# Patient Record
Sex: Female | Born: 1940 | Race: White | Hispanic: No | Marital: Married | Smoking: Former smoker
Health system: Southern US, Community
[De-identification: ages and names within clinical notes are randomized; demographics above are authoritative.]

## PROBLEM LIST (undated history)

## (undated) DIAGNOSIS — Z923 Personal history of irradiation: Secondary | ICD-10-CM

## (undated) DIAGNOSIS — E785 Hyperlipidemia, unspecified: Secondary | ICD-10-CM

## (undated) DIAGNOSIS — K219 Gastro-esophageal reflux disease without esophagitis: Secondary | ICD-10-CM

## (undated) DIAGNOSIS — E039 Hypothyroidism, unspecified: Secondary | ICD-10-CM

## (undated) HISTORY — PX: OTHER SURGICAL HISTORY: SHX169

## (undated) HISTORY — DX: Hypothyroidism, unspecified: E03.9

## (undated) HISTORY — DX: Hyperlipidemia, unspecified: E78.5

## (undated) HISTORY — DX: Personal history of irradiation: Z92.3

## (undated) HISTORY — DX: Gastro-esophageal reflux disease without esophagitis: K21.9

---

## 1997-12-01 ENCOUNTER — Emergency Department (HOSPITAL_COMMUNITY): Admission: EM | Admit: 1997-12-01 | Discharge: 1997-12-01 | Payer: Self-pay | Admitting: Emergency Medicine

## 1998-01-28 ENCOUNTER — Other Ambulatory Visit: Admission: RE | Admit: 1998-01-28 | Discharge: 1998-01-28 | Payer: Self-pay | Admitting: Obstetrics and Gynecology

## 1998-02-03 ENCOUNTER — Ambulatory Visit (HOSPITAL_COMMUNITY): Admission: RE | Admit: 1998-02-03 | Discharge: 1998-02-03 | Payer: Self-pay | Admitting: Obstetrics and Gynecology

## 1998-03-04 ENCOUNTER — Ambulatory Visit (HOSPITAL_COMMUNITY): Admission: RE | Admit: 1998-03-04 | Discharge: 1998-03-04 | Payer: Self-pay | Admitting: Obstetrics and Gynecology

## 1998-06-27 ENCOUNTER — Other Ambulatory Visit: Admission: RE | Admit: 1998-06-27 | Discharge: 1998-06-27 | Payer: Self-pay | Admitting: Obstetrics and Gynecology

## 1998-06-29 ENCOUNTER — Encounter: Payer: Self-pay | Admitting: Emergency Medicine

## 1998-06-29 ENCOUNTER — Inpatient Hospital Stay (HOSPITAL_COMMUNITY): Admission: EM | Admit: 1998-06-29 | Discharge: 1998-07-01 | Payer: Self-pay | Admitting: Emergency Medicine

## 1998-06-29 ENCOUNTER — Encounter: Payer: Self-pay | Admitting: Orthopedic Surgery

## 1999-03-30 ENCOUNTER — Other Ambulatory Visit: Admission: RE | Admit: 1999-03-30 | Discharge: 1999-03-30 | Payer: Self-pay | Admitting: *Deleted

## 1999-10-04 ENCOUNTER — Other Ambulatory Visit: Admission: RE | Admit: 1999-10-04 | Discharge: 1999-10-04 | Payer: Self-pay | Admitting: Obstetrics and Gynecology

## 1999-10-05 ENCOUNTER — Other Ambulatory Visit: Admission: RE | Admit: 1999-10-05 | Discharge: 1999-10-05 | Payer: Self-pay | Admitting: Obstetrics and Gynecology

## 1999-10-05 ENCOUNTER — Encounter (INDEPENDENT_AMBULATORY_CARE_PROVIDER_SITE_OTHER): Payer: Self-pay

## 1999-10-10 ENCOUNTER — Ambulatory Visit (HOSPITAL_COMMUNITY): Admission: RE | Admit: 1999-10-10 | Discharge: 1999-10-10 | Payer: Self-pay | Admitting: Obstetrics and Gynecology

## 1999-10-10 ENCOUNTER — Encounter: Payer: Self-pay | Admitting: Obstetrics and Gynecology

## 1999-12-11 ENCOUNTER — Emergency Department (HOSPITAL_COMMUNITY): Admission: EM | Admit: 1999-12-11 | Discharge: 1999-12-11 | Payer: Self-pay

## 2000-01-25 ENCOUNTER — Encounter: Admission: RE | Admit: 2000-01-25 | Discharge: 2000-01-25 | Payer: Self-pay | Admitting: Obstetrics and Gynecology

## 2000-01-25 ENCOUNTER — Encounter: Payer: Self-pay | Admitting: Obstetrics and Gynecology

## 2000-07-03 ENCOUNTER — Other Ambulatory Visit: Admission: RE | Admit: 2000-07-03 | Discharge: 2000-07-03 | Payer: Self-pay | Admitting: Obstetrics and Gynecology

## 2000-10-22 ENCOUNTER — Other Ambulatory Visit: Admission: RE | Admit: 2000-10-22 | Discharge: 2000-10-22 | Payer: Self-pay | Admitting: Obstetrics and Gynecology

## 2000-10-23 ENCOUNTER — Encounter (INDEPENDENT_AMBULATORY_CARE_PROVIDER_SITE_OTHER): Payer: Self-pay | Admitting: Specialist

## 2000-10-23 ENCOUNTER — Ambulatory Visit (HOSPITAL_COMMUNITY): Admission: RE | Admit: 2000-10-23 | Discharge: 2000-10-23 | Payer: Self-pay | Admitting: Obstetrics and Gynecology

## 2000-11-28 ENCOUNTER — Ambulatory Visit (HOSPITAL_COMMUNITY): Admission: RE | Admit: 2000-11-28 | Discharge: 2000-11-28 | Payer: Self-pay | Admitting: Obstetrics and Gynecology

## 2000-11-28 ENCOUNTER — Encounter: Payer: Self-pay | Admitting: Obstetrics and Gynecology

## 2001-02-03 ENCOUNTER — Encounter: Admission: RE | Admit: 2001-02-03 | Discharge: 2001-02-03 | Payer: Self-pay | Admitting: Obstetrics and Gynecology

## 2001-02-03 ENCOUNTER — Encounter: Payer: Self-pay | Admitting: Obstetrics and Gynecology

## 2001-04-21 ENCOUNTER — Encounter: Admission: RE | Admit: 2001-04-21 | Discharge: 2001-04-21 | Payer: Self-pay | Admitting: Internal Medicine

## 2001-04-21 ENCOUNTER — Encounter: Payer: Self-pay | Admitting: Internal Medicine

## 2001-05-29 ENCOUNTER — Encounter (INDEPENDENT_AMBULATORY_CARE_PROVIDER_SITE_OTHER): Payer: Self-pay

## 2001-05-29 ENCOUNTER — Ambulatory Visit (HOSPITAL_COMMUNITY): Admission: RE | Admit: 2001-05-29 | Discharge: 2001-05-29 | Payer: Self-pay | Admitting: *Deleted

## 2002-01-15 ENCOUNTER — Encounter: Payer: Self-pay | Admitting: Obstetrics and Gynecology

## 2002-01-15 ENCOUNTER — Ambulatory Visit (HOSPITAL_COMMUNITY): Admission: RE | Admit: 2002-01-15 | Discharge: 2002-01-15 | Payer: Self-pay | Admitting: Obstetrics and Gynecology

## 2002-02-12 ENCOUNTER — Encounter: Admission: RE | Admit: 2002-02-12 | Discharge: 2002-02-12 | Payer: Self-pay | Admitting: Obstetrics and Gynecology

## 2002-02-12 ENCOUNTER — Encounter: Payer: Self-pay | Admitting: Obstetrics and Gynecology

## 2002-06-02 ENCOUNTER — Encounter (INDEPENDENT_AMBULATORY_CARE_PROVIDER_SITE_OTHER): Payer: Self-pay | Admitting: Specialist

## 2002-06-02 ENCOUNTER — Ambulatory Visit (HOSPITAL_COMMUNITY): Admission: RE | Admit: 2002-06-02 | Discharge: 2002-06-02 | Payer: Self-pay | Admitting: *Deleted

## 2003-03-11 ENCOUNTER — Other Ambulatory Visit: Admission: RE | Admit: 2003-03-11 | Discharge: 2003-03-11 | Payer: Self-pay | Admitting: Obstetrics and Gynecology

## 2003-03-24 ENCOUNTER — Encounter: Payer: Self-pay | Admitting: Obstetrics and Gynecology

## 2003-03-24 ENCOUNTER — Encounter: Admission: RE | Admit: 2003-03-24 | Discharge: 2003-03-24 | Payer: Self-pay | Admitting: Obstetrics and Gynecology

## 2003-10-15 ENCOUNTER — Ambulatory Visit (HOSPITAL_COMMUNITY): Admission: RE | Admit: 2003-10-15 | Discharge: 2003-10-15 | Payer: Self-pay | Admitting: *Deleted

## 2003-10-15 ENCOUNTER — Encounter (INDEPENDENT_AMBULATORY_CARE_PROVIDER_SITE_OTHER): Payer: Self-pay | Admitting: Specialist

## 2004-04-06 ENCOUNTER — Encounter (HOSPITAL_COMMUNITY): Admission: RE | Admit: 2004-04-06 | Discharge: 2004-06-08 | Payer: Self-pay | Admitting: Endocrinology

## 2004-05-17 ENCOUNTER — Other Ambulatory Visit: Admission: RE | Admit: 2004-05-17 | Discharge: 2004-05-17 | Payer: Self-pay | Admitting: Obstetrics and Gynecology

## 2004-06-27 ENCOUNTER — Encounter (HOSPITAL_COMMUNITY): Admission: RE | Admit: 2004-06-27 | Discharge: 2004-09-25 | Payer: Self-pay | Admitting: Endocrinology

## 2004-08-14 ENCOUNTER — Ambulatory Visit (HOSPITAL_COMMUNITY): Admission: RE | Admit: 2004-08-14 | Discharge: 2004-08-14 | Payer: Self-pay | Admitting: Endocrinology

## 2005-05-07 ENCOUNTER — Ambulatory Visit (HOSPITAL_COMMUNITY): Admission: RE | Admit: 2005-05-07 | Discharge: 2005-05-07 | Payer: Self-pay | Admitting: Obstetrics and Gynecology

## 2005-05-21 ENCOUNTER — Other Ambulatory Visit: Admission: RE | Admit: 2005-05-21 | Discharge: 2005-05-21 | Payer: Self-pay | Admitting: Obstetrics and Gynecology

## 2005-11-12 ENCOUNTER — Encounter (INDEPENDENT_AMBULATORY_CARE_PROVIDER_SITE_OTHER): Payer: Self-pay | Admitting: Specialist

## 2005-11-12 ENCOUNTER — Ambulatory Visit (HOSPITAL_COMMUNITY): Admission: RE | Admit: 2005-11-12 | Discharge: 2005-11-12 | Payer: Self-pay | Admitting: *Deleted

## 2006-01-01 ENCOUNTER — Ambulatory Visit (HOSPITAL_COMMUNITY): Admission: RE | Admit: 2006-01-01 | Discharge: 2006-01-01 | Payer: Self-pay | Admitting: *Deleted

## 2006-04-03 ENCOUNTER — Encounter: Admission: RE | Admit: 2006-04-03 | Discharge: 2006-04-03 | Payer: Self-pay | Admitting: Endocrinology

## 2006-06-07 ENCOUNTER — Ambulatory Visit (HOSPITAL_COMMUNITY): Admission: RE | Admit: 2006-06-07 | Discharge: 2006-06-07 | Payer: Self-pay | Admitting: Obstetrics and Gynecology

## 2007-01-01 ENCOUNTER — Encounter: Admission: RE | Admit: 2007-01-01 | Discharge: 2007-01-01 | Payer: Self-pay | Admitting: Endocrinology

## 2007-09-01 ENCOUNTER — Ambulatory Visit (HOSPITAL_COMMUNITY): Admission: RE | Admit: 2007-09-01 | Discharge: 2007-09-01 | Payer: Self-pay | Admitting: *Deleted

## 2007-09-01 ENCOUNTER — Encounter (INDEPENDENT_AMBULATORY_CARE_PROVIDER_SITE_OTHER): Payer: Self-pay | Admitting: *Deleted

## 2008-04-07 ENCOUNTER — Ambulatory Visit (HOSPITAL_COMMUNITY): Admission: RE | Admit: 2008-04-07 | Discharge: 2008-04-07 | Payer: Self-pay | Admitting: Obstetrics and Gynecology

## 2009-10-19 ENCOUNTER — Encounter
Admission: RE | Admit: 2009-10-19 | Discharge: 2009-10-19 | Payer: Self-pay | Source: Home / Self Care | Admitting: Neurosurgery

## 2010-03-02 ENCOUNTER — Ambulatory Visit (HOSPITAL_COMMUNITY): Admission: RE | Admit: 2010-03-02 | Discharge: 2010-03-02 | Payer: Self-pay | Admitting: Obstetrics and Gynecology

## 2010-06-14 ENCOUNTER — Emergency Department (HOSPITAL_COMMUNITY)
Admission: EM | Admit: 2010-06-14 | Discharge: 2010-06-14 | Payer: Self-pay | Source: Home / Self Care | Admitting: Emergency Medicine

## 2010-06-14 LAB — URINALYSIS, ROUTINE W REFLEX MICROSCOPIC
Bilirubin Urine: NEGATIVE
Hemoglobin, Urine: NEGATIVE
Ketones, ur: NEGATIVE mg/dL
Nitrite: NEGATIVE
Protein, ur: NEGATIVE mg/dL
Specific Gravity, Urine: 1.015 (ref 1.005–1.030)
Urine Glucose, Fasting: NEGATIVE mg/dL
Urobilinogen, UA: 0.2 mg/dL (ref 0.0–1.0)
pH: 6.5 (ref 5.0–8.0)

## 2010-06-14 LAB — CBC
HCT: 41 % (ref 36.0–46.0)
Hemoglobin: 13.4 g/dL (ref 12.0–15.0)
MCH: 29.3 pg (ref 26.0–34.0)
MCHC: 32.7 g/dL (ref 30.0–36.0)
MCV: 89.7 fL (ref 78.0–100.0)
Platelets: 155 10*3/uL (ref 150–400)
RBC: 4.57 MIL/uL (ref 3.87–5.11)
RDW: 12.9 % (ref 11.5–15.5)
WBC: 12.3 10*3/uL — ABNORMAL HIGH (ref 4.0–10.5)

## 2010-06-14 LAB — HEMOCCULT GUIAC POC 1CARD (OFFICE): Fecal Occult Bld: NEGATIVE

## 2010-06-14 LAB — POCT I-STAT, CHEM 8
BUN: 13 mg/dL (ref 6–23)
Calcium, Ion: 0.94 mmol/L — ABNORMAL LOW (ref 1.12–1.32)
Chloride: 105 mEq/L (ref 96–112)
Creatinine, Ser: 0.8 mg/dL (ref 0.4–1.2)
Glucose, Bld: 158 mg/dL — ABNORMAL HIGH (ref 70–99)
HCT: 44 % (ref 36.0–46.0)
Hemoglobin: 15 g/dL (ref 12.0–15.0)
Potassium: 4.6 mEq/L (ref 3.5–5.1)
Sodium: 135 mEq/L (ref 135–145)
TCO2: 23 mmol/L (ref 0–100)

## 2010-06-14 LAB — DIFFERENTIAL
Basophils Absolute: 0 10*3/uL (ref 0.0–0.1)
Basophils Relative: 0 % (ref 0–1)
Eosinophils Absolute: 0 10*3/uL (ref 0.0–0.7)
Eosinophils Relative: 0 % (ref 0–5)
Lymphocytes Relative: 6 % — ABNORMAL LOW (ref 12–46)
Lymphs Abs: 0.7 10*3/uL (ref 0.7–4.0)
Monocytes Absolute: 1 10*3/uL (ref 0.1–1.0)
Monocytes Relative: 9 % (ref 3–12)
Neutro Abs: 10.5 10*3/uL — ABNORMAL HIGH (ref 1.7–7.7)
Neutrophils Relative %: 85 % — ABNORMAL HIGH (ref 43–77)

## 2010-07-01 ENCOUNTER — Encounter: Payer: Self-pay | Admitting: Obstetrics and Gynecology

## 2010-07-01 ENCOUNTER — Encounter: Payer: Self-pay | Admitting: Cardiology

## 2010-07-02 ENCOUNTER — Encounter: Payer: Self-pay | Admitting: Endocrinology

## 2010-07-06 LAB — OCCULT BLOOD, POC DEVICE: Fecal Occult Bld: NEGATIVE

## 2010-10-24 NOTE — Op Note (Signed)
NAMEMALU, PELLEGRINI                ACCOUNT NO.:  [id number]   MEDICAL RECORD NO.:  [id number]          PATIENT TYPE:  AMB   LOCATION:  ENDO                         FACILITY:  [facility]   PHYSICIAN:  Georgiana Spinner, M.D.    DATE OF BIRTH:  August 17, 1940   DATE OF PROCEDURE:  09/01/2007  DATE OF DISCHARGE:                               OPERATIVE REPORT   PROCEDURE:  Upper endoscopy.   INDICATIONS:  GERD.   ANESTHESIA:  Fentanyl 75 mcg, Versed 7.5 mg.   DESCRIPTION OF PROCEDURE:  With the patient mildly sedated in the left  lateral decubitus position, the Pentax videoscopic endoscope was  inserted in the mouth, passed under direct vision through the esophagus  which appeared normal except for one small area of Barrett's esophagus  which we biopsied and photographed.  We entered into the stomach,  fundus, body, antrum, duodenal bulb, and second portion duodenum and all  appeared normal.   From this point the endoscope was slowly withdrawn taking  circumferential views of the duodenal mucosa until the endoscope had  been pulled back, and the stomach placed in retroflexion to view the  stomach from below.  The endoscope was straightened and withdrawn taking  circumferential views of the remaining gastric and esophageal mucosa.  The patient's vital signs and pulse oximeter remained stable.  The  patient tolerated the procedure well without apparent complications.   FINDINGS:  Barrett's esophagus biopsied and photographed.  Await biopsy  report.  The patient will call me for results, and follow up with me as  an outpatient.  Proceed to colonoscopy           ______________________________  Georgiana Spinner, M.D.     GMO/MEDQ  D:  09/01/2007  T:  09/01/2007  Job:  [id number]

## 2010-10-24 NOTE — Op Note (Signed)
[name], [name]                ACCOUNT NO.:  [id number]   MEDICAL RECORD NO.:  [id number]          PATIENT TYPE:  AMB   LOCATION:  ENDO                         FACILITY:  [facility]   PHYSICIAN:  Georgiana Spinner, M.D.    DATE OF BIRTH:  [birth year]   DATE OF PROCEDURE:  09/01/2007  DATE OF DISCHARGE:                               OPERATIVE REPORT   PROCEDURE:  Colonoscopy with polypectomy.   INDICATIONS:  Colon polyps.   ANESTHESIA:  Fentanyl 50 mcg, Versed 5 mg.   PROCEDURE:  With the patient mildly sedated in the left lateral  decubitus position, the Pentax videoscopic colonoscope was inserted in  the rectum, passed through a diverticula filled sigmoid colon to reach  the cecum identified by ileocecal valve and appendiceal orifice, both of  which were photographed.  From this point the colonoscope was slowly  withdrawn taking circumferential views of colonic mucosa stopping at 40  centimeters from anal verge at which point a polyp was seen and  photographed and removed using snare cautery technique setting of 20/150  blended current polyp was retrieved by suctioning it through the  endoscope into a tissue trap.  From this point colonoscope was slowly  withdrawn taking circumferential views remaining colonic mucosa as we  withdrew all the way to the rectum which appeared normal on direct  showed hemorrhoids on retroflexed view.  The endoscope was straightened  and withdrawn.  The patient's vital signs, pulse oximeter remained  stable.  The patient tolerated procedure well without apparent  complications.   FINDINGS:  Internal hemorrhoids moderately severe, sigmoid  diverticulosis and polyp at 40 cm.  Await biopsy report.  The patient  will call me for results and follow-up with me as an outpatient.           ______________________________  Georgiana Spinner, M.D.     GMO/MEDQ  D:  09/01/2007  T:  09/01/2007  Job:  [id number]

## 2010-10-27 NOTE — Procedures (Signed)
[facility]  Patient:    [name], [name] Visit Number: [id number] MRN: [id number]          Service Type: END Location: ENDO Attending Physician:  Sabino Gasser Dictated by:   Sabino Gasser, M.D. Admit Date:  05/29/2001                             Procedure Report  PROCEDURE:  Endoscopy.  SURGEON:  Sabino Gasser, M.D.  INDICATIONS:  Abdominal pain.  ANESTHESIA:  Demerol 60 and Versed 6 mg.  DESCRIPTION OF PROCEDURE:  With the patient mildly sedated in the left lateral decubitus position, the Olympus videoscopic endoscope was inserted in the mouth, and passed under direct vision to the esophagus.  The distal esophagus was approached and showed areas of Barretts esophagus, photographed, and biopsied.  We entered into the stomach.  The fundus, body, antrum, duodenal bulb, and second portion of the duodenum all appeared normal.  From this point, the endoscope was slowly withdrawn taking circumferential views of the entire duodenal mucosa until the endoscope had been pulled back into the stomach and placed on retroflexion to view the stomach from below and this showed a hiatal hernia.  The endoscope was then straightened and withdrawn, taking circumferential views of the entire gastric and esophageal mucosa.  The patients vital signs and pulse oximeter remained stable.  The patient tolerated the procedure well and without apparent complications.  FINDINGS:  There appears to be Barretts esophagus above a hiatal hernia, biopsied.  Await biopsy report.  The patient will call me for the results and follow up with me as an outpatient. Dictated by:   Sabino Gasser, M.D. Attending Physician:  Sabino Gasser DD:  05/29/01 TD:  05/30/01 Job: [id number]

## 2010-10-27 NOTE — Op Note (Signed)
NAMELASHENA, SIGNER NO.:  [id number]   MEDICAL RECORD NO.:  [id number]          PATIENT TYPE:  AMB   LOCATION:  ENDO                         FACILITY:  [facility]   PHYSICIAN:  Georgiana Spinner, M.D.    DATE OF BIRTH:  1941/03/07   DATE OF PROCEDURE:  11/12/2005  DATE OF DISCHARGE:                                 OPERATIVE REPORT   PROCEDURE:  Upper endoscopy with biopsy.   INDICATIONS:  GERD.   ANESTHESIA:  Demerol 60, Versed 5 mg.   PROCEDURE:  With the patient mildly sedated in the left lateral decubitus  position the Olympus videoscopic endoscope was inserted in the mouth and  passed under direct vision through the esophagus which appeared normal until  we reached distal esophagus and there were changes of Barrett's photographed  and biopsied.  We entered into the stomach. Fundus, body, antrum, duodenal  bulb, second portion duodenum appeared normal. From this point the endoscope  was slowly withdrawn taking circumferential views of duodenal mucosa until  the endoscope then pulled back into the stomach and placed in retroflexion  to view the stomach from below. A loose wrap of the GE junction around the  endoscope was noted. The endoscope was straightened and withdrawn taking  circumferential views remaining gastric and esophageal mucosa.  The  patient's vital signs, pulse oximeter remained stable.  The patient  tolerated procedure well without apparent complications.   FINDINGS:  Barrett's esophagus above a loose wrap of the GE junction. Await  biopsy report.  The patient will call me for results and follow-up with me  as an outpatient.           ______________________________  Georgiana Spinner, M.D.     GMO/MEDQ  D:  11/12/2005  T:  11/12/2005  Job:  [id number]

## 2010-10-27 NOTE — Op Note (Signed)
[facility]  Patient:    Brittany Sawyer, Brittany Sawyer Visit Number: [id number] MRN: [id number]          Service Type: END Location: ENDO Attending Physician:  Sabino Gasser Dictated by:   Sabino Gasser, M.D. Proc. Date: 05/29/01 Admit Date:  05/29/2001                             Operative Report  PROCEDURE:  Colonoscopy.  INDICATIONS:  Colon polyp.  ANESTHESIA:  Demerol 60 mg, Versed 6 mg.  DESCRIPTION OF PROCEDURE:   With the patient mildly sedated in the left lateral decubitus position, the Olympus Videoscopic Colonoscope was inserted in the rectum and passed under direct vision into the cecum identified by the ileocecal valve and appendiceal orifice, both of which were photographed. From this point, the colonoscope was fully withdrawn taking circumferential views of the entire colonic mucosa stopping at approximately 20 cm from the anal verge at which point some diverticula were seen and fairly large polyp probably in the order of about 1 cm, it was fairly sessile and soft. It was photographed and subsequently using snare cautery technique setting of 20.20 blended current, the polyp was ensnared and removed and tissue was retrieved by suctioning. There was a small residual amount of polypoid tissue and we ensnared this but the tissue evulsed without cautery. There was some bleeding at this site and it was injected with epinephrine 1.5 cc in divided doses. There appeared to be good hemostasis at this point. The endoscope was then withdrawn taking circumferential views of the remaining colonic mucosa stopping in the rectum, which appeared normal in direct and showed hemorrhoids in retroflex view. The endoscope was straightened and withdrawn. The patients vital signs and pulse oximeter remained stable. The patient tolerated the procedure well without apparent complications.  FINDINGS: 1. Diverticulosis throughout the colon but much more so in the sigmoid  area. 2/ Polyp, fairly large at 20 cm.  PLAN:  Will await tissue biopsy report. Will avoid NSAIDs, anticoagulants, and place the patient on a low residue diet for the next two weeks. Dictated by:   Sabino Gasser, M.D. Attending Physician:  Sabino Gasser DD:  05/29/01 TD:  05/30/01 Job: [id number]

## 2010-10-27 NOTE — Op Note (Signed)
[facility]  Patient:    Brittany Sawyer, Brittany Sawyer                       MRN: [id number] Proc. Date: 10/23/00 Adm. Date:  [id number] Attending:  Malon Kindle                           Operative Report  PREOPERATIVE DIAGNOSIS:  Abnormal uterine bleeding, persistent, on hormone replacement therapy.  POSTOPERATIVE DIAGNOSIS:  Abnormal uterine bleeding, persistent, on hormone replacement therapy with large endometrial polyp.  OPERATION:  Dilation and curettage, hysteroscopic resection of an endometrial polyp.  OPERATOR:  Malachi Pro. Ambrose Mantle, M.D.  ANESTHESIA:  General.  DESCRIPTION OF PROCEDURE:  The patient was brought to the operating room and placed under satisfactory general anesthesia and placed in lithotomy position. The vulva and vagina were prepped with Betadine solution.  The exam revealed the uterus to be posterior, normal size.  The adnexa were free of masses.  The area was draped as a sterile field.  The cervix was drawn into the operative field.  I could not sound the uterus because of the tight cervix.  I therefore tried the small dilators, could not sound the cervix.  Then using the 7-8 dilator on the Hank dilators, I was able to get into the endometrial cavity. I dilated her up to about a 26 dilator when it became quite difficult, so then I used a small hysteroscope and was able to visualize the endometrial polyp. So to be able to use the operative hysteroscope, I dilated her up to a 30 Hanks dilator and then was able to introduce the large hysteroscope equipped with the biopsy instrument.  I took out part of the polyp and visualized it on the posterior surface of the endometrial cavity close to the fundus and then used the polyp forceps without the scope to remove large chunks of the polyp. I then reintroduced the hysteroscope, further reduced the size of the polyp down so that it was flush with the endometrial cavity.  I could see both  tubal ostia extremely well.  There were no other abnormalities in the endometrial cavity.  I did an endocervical and then an endometrial curettage and completed the procedure.  Blood loss was probably less than 5 cc.  The patient was returned to recovery in satisfactory condition. DD:  10/23/00 TD:  10/23/00 Job: [id number]

## 2010-10-27 NOTE — Op Note (Signed)
   NAME:  Brittany Sawyer, Brittany Sawyer                          ACCOUNT NO.:  [id number]   MEDICAL RECORD NO.:  [id number]                   PATIENT TYPE:  AMB   LOCATION:  ENDO                                 FACILITY:  [facility]   PHYSICIAN:  Georgiana Spinner, M.D.                 DATE OF BIRTH:  February 22, 1941   DATE OF PROCEDURE:  06/02/2002  DATE OF DISCHARGE:                                 OPERATIVE REPORT   PROCEDURE:  Endoscopy with biopsy.   INDICATIONS FOR PROCEDURE:  Barrett's esophagus.   ANESTHESIA:  Demerol 50, Versed 5 mg.   PROCEDURE:  With the patient mildly sedated in the left lateral decubitus  position, the Olympus videoscopic endoscope was inserted in the mouth and  passed under direct vision through the esophagus and distal esophagus was  visualized and definite Barrett's was seen, photographed, and biopsied.  We  entered into the stomach.  The fundus, body, antrum, duodenal bulb, and  second portion of the duodenum appeared normal.  From this point, the  endoscope was slowly withdrawn taking circumferential views of the duodenal  mucosa until the endoscope was pulled back into the stomach and placed in  retroflexion to view the stomach from below.  The endoscope was then  straightened and withdrawn taking circumferential views of the remaining  gastric esophageal mucosa.  The patient's vital signs, pulse oximeter  remained stable.  The patient tolerated the procedure well without apparent  complications.   FINDINGS:  Barrett's esophagus.   PLAN:  Await biopsy report.  The patient will call for results and follow up  as an outpatient.                                               Georgiana Spinner, M.D.    GMO/MEDQ  D:  06/02/2002  T:  06/02/2002  Job:  [id number]

## 2010-10-27 NOTE — Op Note (Signed)
NAME:  Brittany Sawyer, FULOP                          ACCOUNT NO.:  [id number]   MEDICAL RECORD NO.:  [id number]                   PATIENT TYPE:  AMB   LOCATION:  ENDO                                 FACILITY:  [facility]   PHYSICIAN:  Georgiana Spinner, M.D.                 DATE OF BIRTH:  March 11, 1941   DATE OF PROCEDURE:  DATE OF DISCHARGE:                                 OPERATIVE REPORT   PROCEDURE:  Upper endoscopy with biopsy.   INDICATIONS FOR PROCEDURE:  Gastroesophageal reflux disease.   ANESTHESIA:  Demerol 60, Versed 6 mg.   DESCRIPTION OF PROCEDURE:  With the patient mildly sedated in the left  lateral decubitus position, the Olympus videoscopic endoscope was inserted  in the mouth and passed under direct vision through the esophagus which  appeared normal until we reached the distal esophagus and there was a  punched out area consistent with island of Barrett's esophagus, photographed  and biopsied.  We entered into the stomach, fundus, body, antrum, duodenal  bulb and second portion of the duodenum and all appeared normal. From this  point, the endoscope was slowly withdrawn taking circumferential views of  the duodenal mucosa until the endoscope was pulled back in the stomach,  placed in retroflexion to view the stomach from below. The endoscope was  then straightened and withdrawn taking circumferential views of the  remaining gastric and esophageal mucosa. The patient's vital signs and pulse  oximeter remained stable. The patient tolerated the procedure well without  apparent complications.   FINDINGS:  [place] of what appeared to be Barrett's esophagus biopsied. Await  biopsy report. The patient will call me for results and followup with me as  an outpatient.                                               Georgiana Spinner, M.D.    GMO/MEDQ  D:  10/15/2003  T:  10/15/2003  Job:  [id number]

## 2010-11-03 ENCOUNTER — Other Ambulatory Visit: Payer: Self-pay | Admitting: Gastroenterology

## 2010-12-27 ENCOUNTER — Other Ambulatory Visit (HOSPITAL_COMMUNITY): Payer: Self-pay | Admitting: Family Medicine

## 2010-12-27 ENCOUNTER — Ambulatory Visit (HOSPITAL_COMMUNITY)
Admission: RE | Admit: 2010-12-27 | Discharge: 2010-12-27 | Disposition: A | Discharge status: Home / Self Care | Payer: Federal, State, Local not specified - PPO | Source: Ambulatory Visit | Attending: Family Medicine | Admitting: Family Medicine

## 2010-12-27 DIAGNOSIS — M7989 Other specified soft tissue disorders: Secondary | ICD-10-CM | POA: Insufficient documentation

## 2010-12-27 DIAGNOSIS — M25439 Effusion, unspecified wrist: Secondary | ICD-10-CM | POA: Insufficient documentation

## 2010-12-27 DIAGNOSIS — R52 Pain, unspecified: Secondary | ICD-10-CM

## 2010-12-27 DIAGNOSIS — M25539 Pain in unspecified wrist: Secondary | ICD-10-CM | POA: Insufficient documentation

## 2010-12-27 DIAGNOSIS — M79609 Pain in unspecified limb: Secondary | ICD-10-CM | POA: Insufficient documentation

## 2011-04-24 ENCOUNTER — Ambulatory Visit (HOSPITAL_COMMUNITY)
Admission: RE | Admit: 2011-04-24 | Discharge: 2011-04-24 | Disposition: A | Discharge status: Home / Self Care | Payer: Federal, State, Local not specified - PPO | Source: Ambulatory Visit | Attending: Family Medicine | Admitting: Family Medicine

## 2011-04-24 ENCOUNTER — Other Ambulatory Visit (HOSPITAL_COMMUNITY): Payer: Self-pay | Admitting: Family Medicine

## 2011-04-24 DIAGNOSIS — J3489 Other specified disorders of nose and nasal sinuses: Secondary | ICD-10-CM | POA: Insufficient documentation

## 2011-04-24 DIAGNOSIS — R059 Cough, unspecified: Secondary | ICD-10-CM

## 2011-04-24 DIAGNOSIS — R05 Cough: Secondary | ICD-10-CM | POA: Insufficient documentation

## 2011-04-24 DIAGNOSIS — R0602 Shortness of breath: Secondary | ICD-10-CM | POA: Insufficient documentation

## 2013-03-16 ENCOUNTER — Other Ambulatory Visit: Payer: Self-pay | Admitting: Gastroenterology

## 2013-03-16 DIAGNOSIS — R109 Unspecified abdominal pain: Secondary | ICD-10-CM

## 2013-03-19 ENCOUNTER — Ambulatory Visit
Admission: RE | Admit: 2013-03-19 | Discharge: 2013-03-19 | Disposition: A | Discharge status: Home / Self Care | Payer: Federal, State, Local not specified - PPO | Source: Ambulatory Visit | Attending: Gastroenterology | Admitting: Gastroenterology

## 2013-03-19 DIAGNOSIS — R109 Unspecified abdominal pain: Secondary | ICD-10-CM

## 2013-04-27 ENCOUNTER — Other Ambulatory Visit (HOSPITAL_COMMUNITY): Payer: Self-pay | Admitting: Gastroenterology

## 2013-04-27 DIAGNOSIS — R140 Abdominal distension (gaseous): Secondary | ICD-10-CM

## 2013-04-27 DIAGNOSIS — R110 Nausea: Secondary | ICD-10-CM

## 2013-04-27 DIAGNOSIS — R109 Unspecified abdominal pain: Secondary | ICD-10-CM

## 2013-05-11 ENCOUNTER — Other Ambulatory Visit (HOSPITAL_COMMUNITY): Payer: Federal, State, Local not specified - PPO

## 2013-05-15 ENCOUNTER — Encounter (HOSPITAL_COMMUNITY)
Admission: RE | Admit: 2013-05-15 | Discharge: 2013-05-15 | Disposition: A | Discharge status: Home / Self Care | Payer: Federal, State, Local not specified - PPO | Source: Ambulatory Visit | Attending: Gastroenterology | Admitting: Gastroenterology

## 2013-05-15 DIAGNOSIS — R109 Unspecified abdominal pain: Secondary | ICD-10-CM | POA: Insufficient documentation

## 2013-05-15 DIAGNOSIS — R140 Abdominal distension (gaseous): Secondary | ICD-10-CM

## 2013-05-15 DIAGNOSIS — R141 Gas pain: Secondary | ICD-10-CM | POA: Insufficient documentation

## 2013-05-15 DIAGNOSIS — R110 Nausea: Secondary | ICD-10-CM | POA: Insufficient documentation

## 2013-05-15 DIAGNOSIS — R142 Eructation: Secondary | ICD-10-CM | POA: Insufficient documentation

## 2013-05-15 MED ORDER — TECHNETIUM TC 99M SULFUR COLLOID
2.1000 | Freq: Once | INTRAVENOUS | Status: AC | PRN
  Administered 2013-05-15: 2.1 via ORAL

## 2013-05-28 ENCOUNTER — Other Ambulatory Visit (HOSPITAL_COMMUNITY): Payer: Self-pay | Admitting: Gastroenterology

## 2013-05-28 DIAGNOSIS — R109 Unspecified abdominal pain: Secondary | ICD-10-CM

## 2013-05-28 DIAGNOSIS — R110 Nausea: Secondary | ICD-10-CM

## 2013-06-02 ENCOUNTER — Ambulatory Visit (HOSPITAL_COMMUNITY)
Admission: RE | Admit: 2013-06-02 | Discharge: 2013-06-02 | Disposition: A | Discharge status: Home / Self Care | Payer: Federal, State, Local not specified - PPO | Source: Ambulatory Visit | Attending: Gastroenterology | Admitting: Gastroenterology

## 2013-06-02 DIAGNOSIS — R109 Unspecified abdominal pain: Secondary | ICD-10-CM

## 2013-06-02 DIAGNOSIS — R110 Nausea: Secondary | ICD-10-CM

## 2013-06-02 MED ORDER — TECHNETIUM TC 99M MEBROFENIN IV KIT
5.0000 | PACK | Freq: Once | INTRAVENOUS | Status: AC | PRN
  Administered 2013-06-02: 5 via INTRAVENOUS

## 2014-03-25 ENCOUNTER — Other Ambulatory Visit (HOSPITAL_COMMUNITY): Payer: Self-pay | Admitting: Obstetrics and Gynecology

## 2014-03-25 DIAGNOSIS — Z1231 Encounter for screening mammogram for malignant neoplasm of breast: Secondary | ICD-10-CM

## 2014-03-30 ENCOUNTER — Ambulatory Visit (HOSPITAL_COMMUNITY): Payer: Federal, State, Local not specified - PPO

## 2014-03-30 ENCOUNTER — Ambulatory Visit (HOSPITAL_COMMUNITY)
Admission: RE | Admit: 2014-03-30 | Discharge: 2014-03-30 | Disposition: A | Discharge status: Home / Self Care | Payer: Federal, State, Local not specified - PPO | Source: Ambulatory Visit | Attending: Obstetrics and Gynecology | Admitting: Obstetrics and Gynecology

## 2014-03-30 DIAGNOSIS — Z1231 Encounter for screening mammogram for malignant neoplasm of breast: Secondary | ICD-10-CM | POA: Insufficient documentation

## 2014-11-23 ENCOUNTER — Ambulatory Visit: Payer: Federal, State, Local not specified - PPO | Admitting: Cardiology

## 2015-02-10 ENCOUNTER — Telehealth: Payer: Self-pay | Admitting: Cardiology

## 2015-02-10 NOTE — Telephone Encounter (Signed)
02/10/2015 Received referral packet [facility] for upcoming appointment on 02/15/2015 with Dr. Swaziland. Records given to [facility]. cbr

## 2015-02-15 ENCOUNTER — Encounter: Payer: Self-pay | Admitting: Cardiology

## 2015-02-15 ENCOUNTER — Ambulatory Visit (INDEPENDENT_AMBULATORY_CARE_PROVIDER_SITE_OTHER): Payer: Federal, State, Local not specified - PPO | Admitting: Cardiology

## 2015-02-15 VITALS — BP 154/64 | HR 66 | Ht 60.0 in | Wt 134.7 lb

## 2015-02-15 DIAGNOSIS — I4510 Unspecified right bundle-branch block: Secondary | ICD-10-CM | POA: Insufficient documentation

## 2015-02-15 DIAGNOSIS — R079 Chest pain, unspecified: Secondary | ICD-10-CM | POA: Diagnosis not present

## 2015-02-15 DIAGNOSIS — E785 Hyperlipidemia, unspecified: Secondary | ICD-10-CM | POA: Insufficient documentation

## 2015-02-15 NOTE — Progress Notes (Signed)
Cardiology Office Note   Date:  02/15/2015   ID:  Brittany Sawyer, DOB 11/05/1940, MRN [id number]  PCP:  [name], MD  Cardiologist:   Peter Swaziland, MD   Chief Complaint  Patient presents with  . Follow-up    chest tightness/pt states no other Sx      History of Present Illness: Brittany Sawyer is a 74 y.o. female who presents for evaluation of chest pain at the request of Dr. Cyndia Bent. She reports having a stress test remotely about 15 yrs ago. No history of HTN, DM. She does have a history of hyperlipidemia and family history of CAD. She reports that over the last 6 months she has noted exertional chest tightness- like a weight on her chest. Relieved with slowing down and taking deep breaths. Noted when carrying groceries or pulling trash can up her driveway. No radiation. No SOB. She has been under stress caring for her husband. She has been intolerant of lipitor due to joint aches.     Past Medical History  Diagnosis Date  . Hypothyroid   . S/P radioactive iodine thyroid ablation   . GERD (gastroesophageal reflux disease)   . Hyperlipidemia     Past Surgical History  Procedure Laterality Date  . Right ankle fracture surgery       Current Outpatient Prescriptions  Medication Sig Dispense Refill  . PROTONIX 40 MG tablet Take 1 tablet by mouth daily.    Marland Kitchen SYNTHROID 100 MCG tablet Take 1 tablet by mouth daily.    . timolol (TIMOPTIC) 0.5 % ophthalmic solution Place 1 drop into both eyes daily.    . TRAVATAN Z 0.004 % SOLN ophthalmic solution Place 1 drop into both eyes daily.     No current facility-administered medications for this visit.    Allergies:   Amoxicillin; Aspirin; Iohexol; and Sulfa antibiotics    Social History:  The patient  reports that she has quit smoking. Her smoking use included Cigarettes. She has a 15 pack-year smoking history. She has never used smokeless tobacco.   Family History:  The patient's family history includes Cancer in her  mother; Heart attack in her father; Heart disease in her brother and sister; Other in her daughter, sister, and son.    ROS:  Please see the history of present illness.   Otherwise, review of systems are positive for none.   All other systems are reviewed and negative.    PHYSICAL EXAM: VS:  BP 154/64 mmHg  Pulse 66  Ht 5' (1.524 m)  Wt 61.1 kg (134 lb 11.2 oz)  BMI 26.31 kg/m2 , BMI Body mass index is 26.31 kg/(m^2). GEN: Well nourished, well developed, in no acute distress HEENT: normal Neck: no JVD, carotid bruits, or masses Cardiac: RRR; normal S1-2. no murmurs, rubs, or gallops,no edema. 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  Respiratory:  clear to auscultation bilaterally, normal work of breathing GI: soft, nontender, nondistended, + BS MS: no deformity or atrophy Skin: warm and dry, no rash Neuro:  Strength and sensation are intact Psych: euthymic mood, full affect   EKG:  EKG is ordered today. The ekg ordered today demonstrates NSR with rate 66. RBBB.    Recent Labs: No results found for requested labs within last 365 days.    Lipid Panel No results found for: CHOL, TRIG, HDL, CHOLHDL, VLDL, LDLCALC, LDLDIRECT    Wt Readings from Last 3 Encounters:  02/15/15 61.1 kg (134 lb 11.2 oz)  Other studies Reviewed: Additional studies/ records that were reviewed today include: Office records from Dr. Cyndia Bent. Review of the above records demonstrates: Labs in May showed normal chemistry panel and TSH. Cholesterol 203, triglycerides-130, HDL-50, LDL-127.    ASSESSMENT AND PLAN:  1.  Exertional chest heaviness and tightness concerning for angina pectoris. Risk factors of HL and family history. RBBB on Ecg. Will schedule for a stress Myoview.  2. RBBB  3. Hyperlipidemia. If she is found to have CAD may need to consider alternative lipid lowering therapy with alternative statin.    Current medicines are reviewed at length with the patient today.  The patient does not  have concerns regarding medicines.  The following changes have been made:  no change  Labs/ tests ordered today include:  Orders Placed This Encounter  Procedures  . Myocardial Perfusion Imaging  . EKG 12-Lead     Disposition:   FU with Dr. Swaziland TBD depending on results of stress test.  Signed, Peter Swaziland, MD  02/15/2015 8:58 AM    [facility] [address], [place], [place], [address] Phone [phone], Fax [phone]

## 2015-02-15 NOTE — Patient Instructions (Signed)
We will schedule you for a nuclear stress test   

## 2015-03-01 ENCOUNTER — Telehealth (HOSPITAL_COMMUNITY): Payer: Self-pay

## 2015-03-01 NOTE — Telephone Encounter (Signed)
Encounter complete. 

## 2015-03-03 ENCOUNTER — Ambulatory Visit (HOSPITAL_COMMUNITY)
Admission: RE | Admit: 2015-03-03 | Discharge: 2015-03-03 | Disposition: A | Discharge status: Home / Self Care | Payer: Federal, State, Local not specified - PPO | Source: Ambulatory Visit | Attending: Cardiology | Admitting: Cardiology

## 2015-03-03 DIAGNOSIS — I4510 Unspecified right bundle-branch block: Secondary | ICD-10-CM

## 2015-03-03 DIAGNOSIS — Z87891 Personal history of nicotine dependence: Secondary | ICD-10-CM | POA: Diagnosis not present

## 2015-03-03 DIAGNOSIS — Z8249 Family history of ischemic heart disease and other diseases of the circulatory system: Secondary | ICD-10-CM | POA: Insufficient documentation

## 2015-03-03 DIAGNOSIS — E785 Hyperlipidemia, unspecified: Secondary | ICD-10-CM | POA: Insufficient documentation

## 2015-03-03 DIAGNOSIS — R079 Chest pain, unspecified: Secondary | ICD-10-CM | POA: Diagnosis not present

## 2015-03-03 LAB — MYOCARDIAL PERFUSION IMAGING
CHL CUP NUCLEAR SDS: 1
CHL CUP RESTING HR STRESS: 50 {beats}/min
CHL CUP STRESS STAGE 1 HR: 61 {beats}/min
CHL CUP STRESS STAGE 3 GRADE: 0 %
CHL CUP STRESS STAGE 3 HR: 60 {beats}/min
CHL CUP STRESS STAGE 4 DBP: 63 mmHg
CHL CUP STRESS STAGE 6 GRADE: 0 %
CHL CUP STRESS STAGE 7 DBP: 76 mmHg
CHL CUP STRESS STAGE 7 GRADE: 0 %
CHL CUP STRESS STAGE 7 HR: 68 {beats}/min
CHL CUP STRESS STAGE 7 SPEED: 0 mph
CHL RATE OF PERCEIVED EXERTION: 16
CSEPED: 6 min
CSEPEDS: 41 s
CSEPEW: 7 METS
CSEPHR: 91 %
LV sys vol: 23 mL
LVDIAVOL: 69 mL
MPHR: 146 {beats}/min
Peak BP: 183 mmHg
Peak HR: 134 {beats}/min
Percent of predicted max HR: 91 %
SRS: 0
SSS: 1
Stage 1 DBP: 68 mmHg
Stage 1 Grade: 0 %
Stage 1 SBP: 147 mmHg
Stage 1 Speed: 0 mph
Stage 2 Grade: 0 %
Stage 2 HR: 60 {beats}/min
Stage 2 Speed: 1 mph
Stage 3 Speed: 1 mph
Stage 4 Grade: 10 %
Stage 4 HR: 96 {beats}/min
Stage 4 SBP: 191 mmHg
Stage 4 Speed: 1.7 mph
Stage 5 DBP: 94 mmHg
Stage 5 Grade: 12 %
Stage 5 HR: 134 {beats}/min
Stage 5 SBP: 183 mmHg
Stage 5 Speed: 2.5 mph
Stage 6 DBP: 85 mmHg
Stage 6 HR: 116 {beats}/min
Stage 6 SBP: 202 mmHg
Stage 6 Speed: 0 mph
Stage 7 SBP: 174 mmHg
TID: 1.29

## 2015-03-03 MED ORDER — TECHNETIUM TC 99M SESTAMIBI GENERIC - CARDIOLITE
10.9000 | Freq: Once | INTRAVENOUS | Status: AC | PRN
Start: 2015-03-03 — End: 2015-03-03
  Administered 2015-03-03: 10.9 via INTRAVENOUS

## 2015-03-03 MED ORDER — TECHNETIUM TC 99M SESTAMIBI GENERIC - CARDIOLITE
29.8000 | Freq: Once | INTRAVENOUS | Status: AC | PRN
  Administered 2015-03-03: 29.8 via INTRAVENOUS

## 2015-03-08 ENCOUNTER — Other Ambulatory Visit: Payer: Self-pay

## 2015-03-08 MED ORDER — ROSUVASTATIN CALCIUM 5 MG PO TABS: ORAL_TABLET | ORAL | Status: DC

## 2015-03-08 MED ORDER — METOPROLOL TARTRATE 25 MG PO TABS: 25.0000 mg | ORAL_TABLET | Freq: Two times a day (BID) | ORAL | Status: DC

## 2015-04-01 ENCOUNTER — Telehealth: Payer: Self-pay | Admitting: Cardiology

## 2015-04-01 NOTE — Telephone Encounter (Signed)
Pt labs show LDL at 115, no hx of ASCVD.  Would suggest stay off x 2 weeks until symptoms resolve.  Then can re-start at 5 mg twice weekly (ie Monday and Friday).  Let her know that many people do well with Crestor when not taken more than 2-3 times per week.

## 2015-04-01 NOTE — Telephone Encounter (Signed)
Spoke to patient. States since starting on rosuvastatin 5mg  she has been having joint pains (knees, ankles, elbows), and feeling very lethargic. She also reports headaches and nausea past 2 evenings.  Advised to stop taking this medication for now, see if symptoms resolve in a few days - will get recommendation from physician to see if alternative recommended.  Pt voiced understanding and agreement w/ plan.

## 2015-04-01 NOTE — Telephone Encounter (Signed)
This message came from the Answering Service:Started taking the generic Lipitor,he now feels tired,sick to his stomach and having joint pains.

## 2015-04-01 NOTE — Telephone Encounter (Signed)
Recommendations communicated to patient, she acknowledged understanding of instructions.

## 2015-04-08 ENCOUNTER — Telehealth: Payer: Self-pay | Admitting: Cardiology

## 2015-04-08 NOTE — Telephone Encounter (Signed)
We can stop metoprolol and see if symptoms improve. I am still concerned that she is having angina although stress test was normal. If symptoms do not improve off medication we will need to consider further evaluation of her coronary status.  Kojo Liby SwazilandJordan MD, [facility] HospitalFACC

## 2015-04-08 NOTE — Telephone Encounter (Signed)
Patient is experiencing side effects since she started metoprolol tartrate (LOPRESSOR) 25 MG tablet

## 2015-04-08 NOTE — Telephone Encounter (Signed)
Returned call to patient.Spoke to husband stated wife is lying down she is not feeling well.Stated since she started taking metoprolol 25 mg twice a day and crestor 5 mg every other day she has felt fatigued,achy,nauseated,headache.Stated on 04/01/15 she was advised to hold crestor for 2 weeks then take 5 mg twice a week.Stated she has held crestor for 1 week and she feels bad.Message sent to Dr.Jordan for advice.

## 2015-04-08 NOTE — Telephone Encounter (Signed)
Returned call to patient's husband.Dr.Jordan advised may stop metoprolol and see if symptoms improve.Advised to call me back next week and report her condition.

## 2015-04-13 NOTE — Telephone Encounter (Signed)
Patient called she stated she feels much better since stopping metoprolol.Advised to keep appointment with Dr.Jordan 05/03/15 at 11:30 am.Advised to call sooner if needed.

## 2015-04-14 ENCOUNTER — Telehealth: Payer: Self-pay | Admitting: Cardiology

## 2015-04-14 NOTE — Telephone Encounter (Signed)
Would like  To have the results of her stress test on 03/03/15  To be faxed over to Dr. Larita Fifehan Badger ..Marland Kitchen

## 2015-05-03 ENCOUNTER — Ambulatory Visit (INDEPENDENT_AMBULATORY_CARE_PROVIDER_SITE_OTHER): Payer: Federal, State, Local not specified - PPO | Admitting: Cardiology

## 2015-05-03 ENCOUNTER — Encounter: Payer: Self-pay | Admitting: Cardiology

## 2015-05-03 VITALS — BP 188/70 | HR 66 | Ht 60.0 in | Wt 133.0 lb

## 2015-05-03 DIAGNOSIS — I4510 Unspecified right bundle-branch block: Secondary | ICD-10-CM

## 2015-05-03 DIAGNOSIS — R079 Chest pain, unspecified: Secondary | ICD-10-CM | POA: Diagnosis not present

## 2015-05-03 DIAGNOSIS — E785 Hyperlipidemia, unspecified: Secondary | ICD-10-CM | POA: Diagnosis not present

## 2015-05-03 NOTE — Patient Instructions (Signed)
You need to exercise regularly  Keep a track of your blood pressure and we will have you come back in a couple of weeks to confirm your readings.

## 2015-05-03 NOTE — Progress Notes (Signed)
Cardiology Office Note   Date:  05/03/2015   ID:  Brittany BarJanet B Pribble, DOB 09-21-1940, MRN [id number]  PCP:  Eartha InchBADGER,MICHAEL C, MD  Cardiologist:   Peter SwazilandJordan, MD   Chief Complaint  Patient presents with  . Follow-up    pt states no chest pain no SOB no light headedness or dizziness no edema      History of Present Illness: Brittany Sawyer is a 74 y.o. female who presents for follow up of chest pain.  No history of HTN, DM. She does have a history of hyperlipidemia and family history of CAD. She reported that over the last 6 months she has noted exertional chest tightness- like a weight on her chest. Relieved with slowing down and taking deep breaths. Noted when carrying groceries or pulling trash can up her driveway. No radiation. No SOB.  Since her last visit she had a nuclear stress test noted below. She was placed on metoprolol but complained of marked fatigue and this was stopped. Also developed myalgias and fatigue on low dose Crestor. She states the symptoms of chest tightness have resolved. She also reports her BP at home is 135-140 systolic.   Past Medical History  Diagnosis Date  . Hypothyroid   . S/P radioactive iodine thyroid ablation   . GERD (gastroesophageal reflux disease)   . Hyperlipidemia     Past Surgical History  Procedure Laterality Date  . Right ankle fracture surgery       Current Outpatient Prescriptions  Medication Sig Dispense Refill  . PROTONIX 40 MG tablet Take 1 tablet by mouth daily.    Marland Kitchen. SYNTHROID 100 MCG tablet Take 1 tablet by mouth daily.    . timolol (TIMOPTIC) 0.5 % ophthalmic solution Place 1 drop into both eyes daily.    . TRAVATAN Z 0.004 % SOLN ophthalmic solution Place 1 drop into both eyes daily.     No current facility-administered medications for this visit.    Allergies:   2,4-d dimethylamine (amisol); Metoprolol; Amoxicillin; Aspirin; Iohexol; and Sulfa antibiotics    Social History:  The patient  reports that she has quit  smoking. Her smoking use included Cigarettes. She has a 15 pack-year smoking history. She has never used smokeless tobacco.   Family History:  The patient's family history includes Cancer in her mother; Heart attack in her father; Heart disease in her brother and sister; Other in her daughter, sister, and son.    ROS:  Please see the history of present illness.   Otherwise, review of systems are positive for none.   All other systems are reviewed and negative.    PHYSICAL EXAM: VS:  BP 188/70 mmHg  Pulse 66  Ht 5' (1.524 m)  Wt 60.328 kg (133 lb)  BMI 25.97 kg/m2 , BMI Body mass index is 25.97 kg/(m^2). GEN: Well nourished, well developed, in no acute distress HEENT: normal Neck: no JVD, carotid bruits, or masses Cardiac: RRR; normal S1-2. no murmurs, rubs, or gallops,no edema. 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  Respiratory:  clear to auscultation bilaterally, normal work of breathing GI: soft, nontender, nondistended, + BS MS: no deformity or atrophy Skin: warm and dry, no rash Neuro:  Strength and sensation are intact Psych: euthymic mood, full affect   EKG:  EKG is not ordered today.    Recent Labs: No results found for requested labs within last 365 days.    Lipid Panel No results found for: CHOL, TRIG, HDL, CHOLHDL, VLDL, LDLCALC, LDLDIRECT  Wt Readings from Last 3 Encounters:  05/03/15 60.328 kg (133 lb)  03/03/15 60.782 kg (134 lb)  02/15/15 61.1 kg (134 lb 11.2 oz)      Other studies Reviewed: Additional studies/ records that were reviewed today include:   Stress Myoview:  Study Highlights     The left ventricular ejection fraction is normal (55-65%).  Nuclear stress EF: 66%.  ST segment depression of 2 mm was noted during stress in the II, aVF, III, V5 and V6 leads, beginning at 4 minutes of stress.  T wave inversion was noted during stress.  The study is normal.  This is a low risk study.  1. Nl Perfusion and EF 2.Positive GXT 3. Low risk  study. Can not R/O balanced ischemia      ASSESSMENT AND PLAN:  1.  Exertional chest heaviness and tightness. Symptoms have improved. Stress Myoview showed no ischemia. She did have a hypertensive BP response consistent with poor conditioning. Recommend regular aerobic exercise.   2. RBBB  3. Hyperlipidemia. Intolerant of statins including low dose crestor.  4, elevated BP. BP is quite elevated today. I have recommended she check her blood pressure daily and keep a diary. Will bring her cuff in 2 weeks and let us correlate with reading here. If normal readings we will forgo additional therapy. If BP remains high will recommend antihypertensive therapy.   Current medicines are reviewed at length with the patient today.  The patient does not have concerns regarding medicines.  The following changes have been made:  no change  Labs/ tests ordered today include:  No orders of the defined types were placed in this encounter.     Disposition:   FU with Dr. Swaziland 6 months. Signed, Peter Swaziland, MD  05/03/2015 12:26 PM    [facility] [address] [place], [place], [address] Phone [phone], Fax [phone]

## 2015-05-11 ENCOUNTER — Telehealth: Payer: Self-pay | Admitting: Cardiology

## 2015-05-11 MED ORDER — AMLODIPINE BESYLATE 2.5 MG PO TABS: 2.5000 mg | ORAL_TABLET | Freq: Every day | ORAL | Status: DC

## 2015-05-11 NOTE — Telephone Encounter (Signed)
Returned call to patient.Dr.Jordan advised to start amlodipine 2.5 mg.daily.Advised to continue to monitor B/P and call back if continues to be elevated.

## 2015-05-11 NOTE — Telephone Encounter (Signed)
Returned call to patient she wanted Dr.Jordan to know her B/P has been elevated averaging 154/64.Systolic 151,155.Pulse 50 to 56.Message sent to Dr.Jordan for advice.

## 2015-05-11 NOTE — Telephone Encounter (Signed)
Please call,concerning her blood pressure.

## 2015-05-11 NOTE — Addendum Note (Signed)
Addended by: Meda KlinefelterPUGH, Margrit Minner JOHNSON D on: 05/11/2015 04:01 PM   Modules accepted: Orders

## 2015-05-11 NOTE — Telephone Encounter (Signed)
I would recommend starting her on amlodipine at low dose of 2.5 mg daily and continue to monitor BP  Brittany Henken SwazilandJordan MD, [facility]

## 2015-06-28 ENCOUNTER — Other Ambulatory Visit: Payer: Self-pay | Admitting: Cardiology

## 2015-06-28 MED ORDER — AMLODIPINE BESYLATE 2.5 MG PO TABS: 2.5000 mg | ORAL_TABLET | Freq: Every day | ORAL | Status: DC

## 2016-01-06 ENCOUNTER — Telehealth: Payer: Self-pay | Admitting: Cardiology

## 2016-01-06 ENCOUNTER — Telehealth: Payer: Self-pay | Admitting: *Deleted

## 2016-01-06 DIAGNOSIS — Z79899 Other long term (current) drug therapy: Secondary | ICD-10-CM

## 2016-01-06 MED ORDER — LISINOPRIL 10 MG PO TABS: 10.0000 mg | ORAL_TABLET | Freq: Every day | ORAL | 3 refills | Status: DC

## 2016-01-06 NOTE — Telephone Encounter (Signed)
Spoke with husband and explained med change, need for repeat labs, monitoring of BP for 10-14 days (no more than 2x/daily, at least 1-2 hours after BP meds are taken). Husband voiced understanding. Aware med was sent to pharmacy. He will bring patient for labs early the week of August 7.  

## 2016-01-06 NOTE — Telephone Encounter (Signed)
Telephone Open    01/06/2016 [facility]  Peter M Swaziland, MD  Cardiology   Medication Problem  Reason for call   Conversation: Medication Problem  (Oldest Message First)        01/06/16 1:36 PM    Margaret Pyle (Emergency Contact) contacted Lindell Spar, RN  Lindell Spar, RN      01/06/16 1:36 PM  Note    Patient's husband called in to [facility] Refill team - wife needs refill of amlodipine but they want to see if this can be changed to a different medication. Patient has been having "hot flashes" which patient/husband have decided is from amlodipine. Informed husband will have to send message to MD/clinical pharmacy staff to advise on a med change. Patient will be out of medication on Sunday.           01/06/16 1:38 PM  Lindell Spar, RN routed this conversation to Peter M Swaziland, MD . Anticoag Cv [facility]l  Rosalee Kaufman, RPH-CPP  to Lindell Spar, RN     01/06/16 2:38 PM  Note    Try lisinopril 10 mg daily, repeat BMET in 7-10 days and have her monitor home BP for next several weeks

## 2016-01-06 NOTE — Telephone Encounter (Signed)
LM on home & mobile # for patient/husband to call back - need to explain med change, monitoring BPs, lab work   Lisinopril sent to pharmacy

## 2016-01-06 NOTE — Telephone Encounter (Signed)
Patient's husband called in to [facility] Refill team - wife needs refill of amlodipine but they want to see if this can be changed to a different medication. Patient has been having "hot flashes" which patient/husband have decided is from amlodipine. Informed husband will have to send message to MD/clinical pharmacy staff to advise on a med change. Patient will be out of medication on Sunday.

## 2016-01-06 NOTE — Telephone Encounter (Signed)
Try lisinopril 10 mg daily, repeat BMET in 7-10 days and have her monitor home BP for next several weeks

## 2016-01-06 NOTE — Telephone Encounter (Signed)
Spoke with husband and explained med change, need for repeat labs, monitoring of BP for 10-14 days (no more than 2x/daily, at least 1-2 hours after BP meds are taken). Husband voiced understanding. Aware med was sent to pharmacy. He will bring patient for labs early the week of August 7.

## 2016-03-20 ENCOUNTER — Telehealth: Payer: Self-pay | Admitting: Cardiology

## 2016-03-20 NOTE — Telephone Encounter (Signed)
New message    Pt c/o medication issue:  1. Name of Medication: lisinopril (PRINIVIL,ZESTRIL) 10 MG tablet  2. How are you currently taking this medication (dosage and times per day)? 10 mg   3. Are you having a reaction (difficulty breathing--STAT)? Weakness, tiredness, cough   4. What is your medication issue? Side effects.

## 2016-03-20 NOTE — Telephone Encounter (Signed)
S/w husband(DPR on file) pt is having s/e from Lisinopril:dry cough,intermittant Ha, weakness and lethargy. She is currently taking 1/2 tablet (5mg ). Scheduled appt for Friday 03-23-16@8am (declined appt 03-21-16) will be here 7:45 to check in with med list.

## 2016-03-23 ENCOUNTER — Encounter: Payer: Self-pay | Admitting: Physician Assistant

## 2016-03-23 ENCOUNTER — Ambulatory Visit (INDEPENDENT_AMBULATORY_CARE_PROVIDER_SITE_OTHER): Payer: Federal, State, Local not specified - PPO | Admitting: Physician Assistant

## 2016-03-23 VITALS — BP 130/76 | HR 64 | Ht 60.0 in | Wt 131.0 lb

## 2016-03-23 DIAGNOSIS — E785 Hyperlipidemia, unspecified: Secondary | ICD-10-CM

## 2016-03-23 DIAGNOSIS — R079 Chest pain, unspecified: Secondary | ICD-10-CM

## 2016-03-23 DIAGNOSIS — E039 Hypothyroidism, unspecified: Secondary | ICD-10-CM | POA: Diagnosis not present

## 2016-03-23 DIAGNOSIS — I4510 Unspecified right bundle-branch block: Secondary | ICD-10-CM

## 2016-03-23 MED ORDER — ISOSORBIDE MONONITRATE ER 30 MG PO TB24: 30.0000 mg | ORAL_TABLET | Freq: Every day | ORAL | 9 refills | Status: DC

## 2016-03-23 MED ORDER — ROSUVASTATIN CALCIUM 5 MG PO TABS: 5.0000 mg | ORAL_TABLET | ORAL | 11 refills | Status: DC

## 2016-03-23 NOTE — Progress Notes (Signed)
Cardiology Office Note    Date:  03/23/2016   ID:  Brittany Sawyer, DOB [birth year], MRN [id number]  PCP:  Brittany Inch, MD  Cardiologist:  Dr. Swaziland  Chief Complaint  Patient presents with  . Follow-up    seen for Dr. Swaziland  . Chest Pain    pt c/o chest pain, and coughing  headaches and tiredness which she believes side effects from lisinopril    History of Present Illness:  Brittany Sawyer is a 75 y.o. female with PMH of hypothyroidism s/p radioactive iodine thyroid ablation, GERD and hyperlipidemia. She was last seen by Dr. Swaziland on 05/03/2015 for evaluation of chest pain. She has no history of hypertension and diabetes. She does have family history of CAD. She has been intolerant to Lipitor due to joint aches. Given the exertional nature of her chest discomfort, a stress Myoview was obtained on 03/03/2015 which showed EF 66%, 2 mm ST depression noted in II, III, aVF, V5 and V6 leads that began at 4 minutes into the stress, otherwise normal perfusion, overall consider low risk study however cannot rule out balanced ischemia given positive GXT. She also had hypertensive BP response during the stress test, she was placed on trial of medical therapy with metoprolol 25 mg twice a day and aspirin daily. Unfortunately she could not tolerate metoprolol as it causes her to have hot flushes. She was later placed on lisinopril. Crestor 5 mg every other day was recommended given her prior intolerance to Lipitor However this does not appear to have been started.  I'm not entirely sure what was causing her hot flushes, whether it was Crestor or metoprolol or could be something else. However she thinks it is the metoprolol. She is now on lisinopril, however has been dealing with the side effect of cough compounded on the fact that she also has nasal drainage. (She says her cough was started after a simple and before nasal drainage) She says lisinopril has been making the cough worse. For the past  several weeks however she has been also noticing significant episodes of exertional chest discomfort and shortness of breath. It appears she was seen for the same symptom prior to her stress test last year, however symptom went away afterward. Now the symptom has came back, it is noticeable only during exertion and relieved by rest. I have discussed with the patient various options, given the abnormal GXT portion of the stress test, I do not think a repeat stress test would be beneficial in this case. I am fine with stopping her lisinopril to see if her cough would improve, I have also put her on 30 mg daily of Imdur to help with antianginal purposes. However I think her exertional symptom is really concerning for progressive stable angina. I did discuss with the patient cardiac catheterization, however he wished to think about it. During the meantime we will obtain echocardiogram. She will follow up with Dr. Swaziland or me on a day Dr. Swaziland is also in the office. If she still have chest discomfort at that time, I will try to urge her again to consider cardiac catheterization to definitively assess her coronary arteries.   Past Medical History:  Diagnosis Date  . GERD (gastroesophageal reflux disease)   . Hyperlipidemia   . Hypothyroid   . S/P radioactive iodine thyroid ablation     Past Surgical History:  Procedure Laterality Date  . right ankle fracture surgery      Current Medications: Outpatient Medications  Prior to Visit  Medication Sig Dispense Refill  . PROTONIX 40 MG tablet Take 1 tablet by mouth daily.    . timolol (TIMOPTIC) 0.5 % ophthalmic solution Place 1 drop into both eyes daily.    . TRAVATAN Z 0.004 % SOLN ophthalmic solution Place 1 drop into both eyes daily.    Marland Kitchen lisinopril (PRINIVIL,ZESTRIL) 10 MG tablet Take 1 tablet (10 mg total) by mouth daily. (Patient taking differently: Take 5 mg by mouth daily. ) 30 tablet 3  . SYNTHROID 100 MCG tablet Take 1 tablet by mouth daily.      No facility-administered medications prior to visit.      Allergies:   2,4-d dimethylamine (amisol); Metoprolol; Amoxicillin; Aspirin; Iohexol; and Sulfa antibiotics   Social History   Social History  . Marital status: Married    Spouse name: N/A  . Number of children: 2  . Years of education: N/A   Social History Main Topics  . Smoking status: Former Smoker    Packs/day: 0.50    Years: 30.00    Types: Cigarettes  . Smokeless tobacco: Never Used  . Alcohol use None  . Drug use: Unknown  . Sexual activity: Not Asked   Other Topics Concern  . None   Social History Narrative  . None     Family History:  The patient's family history includes Cancer in her mother; Heart attack in her father; Heart disease in her brother and sister; Other in her daughter, sister, and son.   ROS:   Please see the history of present illness.    ROS All other systems reviewed and are negative.   PHYSICAL EXAM:   VS:  BP 130/76 (BP Location: Right Arm, Patient Position: Sitting, Cuff Size: Normal)   Pulse 64   Ht 5' (1.524 m)   Wt 131 lb (59.4 kg)   SpO2 99%   BMI 25.58 kg/m    GEN: Well nourished, well developed, in no acute distress  HEENT: normal  Neck: no JVD, carotid bruits, or masses Cardiac: RRR; no murmurs, rubs, or gallops,no edema  Respiratory:  clear to auscultation bilaterally, normal work of breathing GI: soft, nontender, nondistended, + BS MS: no deformity or atrophy  Skin: warm and dry, no rash Neuro:  Alert and Oriented x 3, Strength and sensation are intact Psych: euthymic mood, full affect  Wt Readings from Last 3 Encounters:  03/23/16 131 lb (59.4 kg)  05/03/15 133 lb (60.3 kg)  03/03/15 134 lb (60.8 kg)      Studies/Labs Reviewed:   EKG:  EKG is ordered today.  The ekg ordered today demonstrates Normal sinus rhythm with right bundle branch block.  Recent Labs: No results found for requested labs within last 8760 hours.   Lipid Panel No results found  for: CHOL, TRIG, HDL, CHOLHDL, VLDL, LDLCALC, LDLDIRECT  Additional studies/ records that were reviewed today include:   myoview 03/03/2015 Study Highlights    The left ventricular ejection fraction is normal (55-65%).  Nuclear stress EF: 66%.  ST segment depression of 2 mm was noted during stress in the II, aVF, III, V5 and V6 leads, beginning at 4 minutes of stress.  T wave inversion was noted during stress.  The study is normal.  This is a low risk study.   1. Nl Perfusion and EF 2.Positive GXT 3. Low risk study. Can not R/O balanced ischemia      ASSESSMENT:    1. Exertional chest pain   2. Hyperlipidemia, unspecified  hyperlipidemia type   3. Hypothyroidism, unspecified type   4. RBBB      PLAN:  In order of problems listed above:  1. Exertional chest pain: Her symptom is very concerning for progressive stable angina. Given the fact that she had a Myoview in 2016 that showed normal perfusion, however had a 2 mm ST depression in the inferolateral leads on GXT portion, my recommendation would be for her to undergo a cardiac catheterization to definitively assess the coronaries. However she is very hesitant to undergo an invasive study. She wished to think about this. Her last episode of chest pain was 2 days ago. I have added a 30 mg daily of Imdur to help with antianginal purposes. She could not tolerate beta blocker before due to questionable flushing sensation (I also question if the flushing is related to Crestor). I have stopped her lisinopril given the addition of Imdur, also because she complained of increased cough on lisinopril. Obtain outpatient echocardiogram.  2. Hyperlipidemia: She is not on statin medication, however she is willing to give this Crestor 5, I will start 5 mg every other day Crestor. If she has flushing sensation again, then I will start her on Zetia.  3. Hypothyroidism: On Synthroid, followed by PCP    Medication Adjustments/Labs and Tests  Ordered: Current medicines are reviewed at length with the patient today.  Concerns regarding medicines are outlined above.  Medication changes, Labs and Tests ordered today are listed in the Patient Instructions below. Patient Instructions  Medication Instructions:  STOP- Lisinopril START- Crestor 5 mg every other day START- Isosorbide(Imdur) 30 mg daily  Labwork: None Ordered  Testing/Procedures: Your physician has requested that you have an echocardiogram. Echocardiography is a painless test that uses sound waves to create images of your heart. It provides your doctor with information about the size and shape of your heart and how well your heart's chambers and valves are working. This procedure takes approximately one hour. There are no restrictions for this procedure.  Follow-Up: Your physician recommends that you schedule a follow-up appointment in: With Dr SwazilandJordan or Lisabeth DevoidMeng when Dr SwazilandJordan in office   Any Other Special Instructions Will Be Listed Below (If Applicable).   If you need a refill on your cardiac medications before your next appointment, please call your pharmacy.      Ramond DialSigned, Shaddai Shapley, [place]  03/23/2016 10:30 AM    [facility] [address], [place], [place]  [address] Phone: [phone]; Fax: [phone]

## 2016-03-23 NOTE — Patient Instructions (Signed)
Medication Instructions:  STOP- Lisinopril START- Crestor 5 mg every other day START- Isosorbide(Imdur) 30 mg daily  Labwork: None Ordered  Testing/Procedures: Your physician has requested that you have an echocardiogram. Echocardiography is a painless test that uses sound waves to create images of your heart. It provides your doctor with information about the size and shape of your heart and how well your heart's chambers and valves are working. This procedure takes approximately one hour. There are no restrictions for this procedure.  Follow-Up: Your physician recommends that you schedule a follow-up appointment in: With Dr SwazilandJordan or Lisabeth DevoidMeng when Dr SwazilandJordan in office   Any Other Special Instructions Will Be Listed Below (If Applicable).   If you need a refill on your cardiac medications before your next appointment, please call your pharmacy.

## 2016-04-09 ENCOUNTER — Ambulatory Visit (HOSPITAL_COMMUNITY): Payer: Federal, State, Local not specified - PPO | Attending: Cardiology

## 2016-04-09 ENCOUNTER — Other Ambulatory Visit: Payer: Self-pay

## 2016-04-09 DIAGNOSIS — E785 Hyperlipidemia, unspecified: Secondary | ICD-10-CM | POA: Insufficient documentation

## 2016-04-09 DIAGNOSIS — R079 Chest pain, unspecified: Secondary | ICD-10-CM | POA: Diagnosis present

## 2016-04-25 ENCOUNTER — Encounter: Payer: Self-pay | Admitting: Physician Assistant

## 2016-04-25 ENCOUNTER — Ambulatory Visit (INDEPENDENT_AMBULATORY_CARE_PROVIDER_SITE_OTHER): Payer: Federal, State, Local not specified - PPO | Admitting: Physician Assistant

## 2016-04-25 VITALS — BP 160/58 | HR 60

## 2016-04-25 DIAGNOSIS — R079 Chest pain, unspecified: Secondary | ICD-10-CM

## 2016-04-25 DIAGNOSIS — E785 Hyperlipidemia, unspecified: Secondary | ICD-10-CM | POA: Diagnosis not present

## 2016-04-25 DIAGNOSIS — I4510 Unspecified right bundle-branch block: Secondary | ICD-10-CM | POA: Diagnosis not present

## 2016-04-25 DIAGNOSIS — E039 Hypothyroidism, unspecified: Secondary | ICD-10-CM

## 2016-04-25 MED ORDER — LOSARTAN POTASSIUM 25 MG PO TABS: 25.0000 mg | ORAL_TABLET | Freq: Every day | ORAL | 3 refills | Status: DC

## 2016-04-25 NOTE — Progress Notes (Signed)
Cardiology Office Note    Date:  04/25/2016   ID:  Brittany BarJanet B Shanker, DOB 1941/05/21, MRN [id number]  PCP:  Eartha InchBADGER,MICHAEL C, MD  Cardiologist:  Dr. SwazilandJordan  Chief Complaint  Patient presents with  . Follow-up    seen for Dr. SwazilandJordan, exertional chest pain    History of Present Illness:  Brittany Sawyer is a 75 y.o. female with PMH of hypothyroidism s/p radioactive iodine thyroid ablation, GERD and hyperlipidemia. She was last seen by Dr. SwazilandJordan on 05/03/2015 for evaluation of chest pain. She has no history of hypertension and diabetes. She does have family history of CAD. She has been intolerant to Lipitor due to joint aches. Given the exertional nature of her chest discomfort, a stress Myoview was obtained on 03/03/2015 which showed EF 66%, 2 mm ST depression noted in II, III, aVF, V5 and V6 leads that began at 4 minutes into the stress, otherwise normal perfusion, overall consider low risk study however cannot rule out balanced ischemia given positive GXT. She also had hypertensive BP response during the stress test, she was placed on trial of medical therapy with metoprolol 25 mg twice a day and aspirin daily. Unfortunately she could not tolerate metoprolol as it causes her to have hot flushes. She was later placed on lisinopril. Crestor 5 mg every other day was recommended given her prior intolerance to Lipitor.  I last saw the patient in the clinic on 03/23/2016. I was not entirely sure what was causing her hot flushes, whether it was Crestor or metoprolol or could be something else. However she thinks it is the metoprolol. She is now on lisinopril, however has been dealing with the side effect of cough compounded on the fact that she also has nasal drainage. (She says her cough was started after lisinopril and before nasal drainage) when saw her in the clinic, she was complaining of exertional chest discomfort and shortness of breath. Given her previous abnormal GXT portion of stress test, I did  not think a repeat stress test would be beneficial in this case. I did discuss various options with her including cardiac catheterization, however she wish to think about it. She wished to try a trial of medical therapy with 30 mg Imdur. I also stopped her lisinopril. I also urged her to consider starting Crestor 5 mg every other day. Echocardiogram obtained on 04/09/2016 showed EF 65-70%, grade 1 diastolic dysfunction, mild TR.  She presents today for cardiology office visit, she has not had any significant chest pain recently. She has been ambulating with her cats on a daily basis. She did have occasional chest pain with emotional disturbance. She told me she did have a very mild chest discomfort last night when her 4 cats started fighting each other, this resolved soon after she calm down. Otherwise she has not had any noticeable exertional symptom after starting on the Imdur. She did have headache initially, however this has resolved and she is tolerating the medication. Her cough has also resolved, I will start on losartan 25 mg daily. She has attributed her cough to the previous lisinopril. Unfortunately she has not started on the Crestor 5 mg every other day. I have encouraged her to start on that medication. She can follow-up with cardiology in 2 months for reassessment. I did tell her if her chest pain does come back and related to exertion, I would recommend a cardiac catheterization instead of a stress test.    Past Medical History:  Diagnosis Date  .  GERD (gastroesophageal reflux disease)   . Hyperlipidemia   . Hypothyroid   . S/P radioactive iodine thyroid ablation     Past Surgical History:  Procedure Laterality Date  . right ankle fracture surgery      Current Medications: Outpatient Medications Prior to Visit  Medication Sig Dispense Refill  . isosorbide mononitrate (IMDUR) 30 MG 24 hr tablet Take 1 tablet (30 mg total) by mouth daily. 30 tablet 9  . levothyroxine (SYNTHROID,  LEVOTHROID) 125 MCG tablet Take 125 mcg by mouth daily before breakfast.    . PROTONIX 40 MG tablet Take 1 tablet by mouth daily.    . timolol (TIMOPTIC) 0.5 % ophthalmic solution Place 1 drop into both eyes daily.    . TRAVATAN Z 0.004 % SOLN ophthalmic solution Place 1 drop into both eyes daily.    . rosuvastatin (CRESTOR) 5 MG tablet Take 1 tablet (5 mg total) by mouth every other day. (Patient not taking: Reported on 04/25/2016) 30 tablet 11   No facility-administered medications prior to visit.      Allergies:   2,4-d dimethylamine (amisol); Metoprolol; Amoxicillin; Aspirin; Iohexol; and Sulfa antibiotics   Social History   Social History  . Marital status: Married    Spouse name: N/A  . Number of children: 2  . Years of education: N/A   Social History Main Topics  . Smoking status: Former Smoker    Packs/day: 0.50    Years: 30.00    Types: Cigarettes  . Smokeless tobacco: Never Used  . Alcohol use None  . Drug use: Unknown  . Sexual activity: Not Asked   Other Topics Concern  . None   Social History Narrative  . None     Family History:  The patient's family history includes Cancer in her mother; Heart attack in her father; Heart disease in her brother and sister; Other in her daughter, sister, and son.   ROS:   Please see the history of present illness.    ROS All other systems reviewed and are negative.   PHYSICAL EXAM:   VS:  BP (!) 160/58   Pulse 60    GEN: Well nourished, well developed, in no acute distress  HEENT: normal  Neck: no JVD, carotid bruits, or masses Cardiac: RRR; no murmurs, rubs, or gallops,no edema  Respiratory:  clear to auscultation bilaterally, normal work of breathing GI: soft, nontender, nondistended, + BS MS: no deformity or atrophy  Skin: warm and dry, no rash Neuro:  Alert and Oriented x 3, Strength and sensation are intact Psych: euthymic mood, full affect  Wt Readings from Last 3 Encounters:  03/23/16 131 lb (59.4 kg)    05/03/15 133 lb (60.3 kg)  03/03/15 134 lb (60.8 kg)      Studies/Labs Reviewed:   EKG:  EKG is not ordered today.   Recent Labs: No results found for requested labs within last 8760 hours.   Lipid Panel No results found for: CHOL, TRIG, HDL, CHOLHDL, VLDL, LDLCALC, LDLDIRECT  Additional studies/ records that were reviewed today include:   Myoview 03/03/2015 Study Highlights    The left ventricular ejection fraction is normal (55-65%).  Nuclear stress EF: 66%.  ST segment depression of 2 mm was noted during stress in the II, aVF, III, V5 and V6 leads, beginning at 4 minutes of stress.  T wave inversion was noted during stress.  The study is normal.  This is a low risk study.  1. Nl Perfusion and EF 2.Positive GXT  3. Low risk study. Can not R/O balanced ischemia      Echo 04/09/2016 LV EF: 65% -   70%  - Left ventricle: The cavity size was normal. Systolic function was   vigorous. The estimated ejection fraction was in the range of 65%   to 70%. Wall motion was normal; there were no regional wall   motion abnormalities. Doppler parameters are consistent with   abnormal left ventricular relaxation (grade 1 diastolic   dysfunction). Doppler parameters are consistent with elevated   ventricular end-diastolic filling pressure. - Aortic valve: Trileaflet; normal thickness leaflets. There was no   regurgitation. - Aortic root: The aortic root was normal in size. - Ascending aorta: The ascending aorta was normal in size. - Mitral valve: Calcified annulus. Mildly thickened leaflets .   There was trivial regurgitation. - Left atrium: The atrium was normal in size. - Right ventricle: Systolic function was normal. - Tricuspid valve: There was mild regurgitation. - Pulmonic valve: There was no regurgitation. - Pulmonary arteries: Systolic pressure was within the normal   range. - Inferior vena cava: The vessel was normal in size. - Pericardium, extracardiac:  There was no pericardial effusion.   ASSESSMENT:    1. Exertional chest pain   2. Hyperlipidemia, unspecified hyperlipidemia type   3. Hypothyroidism, unspecified type   4. RBBB      PLAN:  In order of problems listed above:  1. Exertional chest pain: Improved with Imdur, no further chest discomfort except when she is emotionally stressed. Will continue on medical therapy for now unless chest discomfort come back. Low threshold for cardiac catheterization if she started having chest discomfort with exertion again  2. HLD: She has not started her on her Crestor, I encouraged her to start today.  3. Hypothyroidism: on Synthroid    Medication Adjustments/Labs and Tests Ordered: Current medicines are reviewed at length with the patient today.  Concerns regarding medicines are outlined above.  Medication changes, Labs and Tests ordered today are listed in the Patient Instructions below. Patient Instructions  Medication Instructions:  START- Losartan 25 mg daily and it's ok to start taking Crestor 5 mg  Labwork: None Ordered  Testing/Procedures: None Ordered  Follow-Up: Your physician recommends that you schedule a follow-up appointment in: 2 Months   Any Other Special Instructions Will Be Listed Below (If Applicable).        Happy Thanksgiving   If you need a refill on your cardiac medications before your next appointment, please call your pharmacy.      Ramond DialSigned, Jaleigha Deane, [place]  04/25/2016 12:12 PM    [facility] [address], [place], [place]  [address] Phone: [phone]; Fax: [phone]

## 2016-04-25 NOTE — Patient Instructions (Signed)
Medication Instructions:  START- Losartan 25 mg daily and it's ok to start taking Crestor 5 mg  Labwork: None Ordered  Testing/Procedures: None Ordered  Follow-Up: Your physician recommends that you schedule a follow-up appointment in: 2 Months   Any Other Special Instructions Will Be Listed Below (If Applicable).        Happy Thanksgiving   If you need a refill on your cardiac medications before your next appointment, please call your pharmacy.

## 2016-05-09 ENCOUNTER — Telehealth: Payer: Self-pay | Admitting: Cardiology

## 2016-05-09 NOTE — Telephone Encounter (Signed)
New Message  Pt voiced wanting to know if this is normal or abnormal: 127/56 Pulse 62  Please f/u with pt

## 2016-05-09 NOTE — Telephone Encounter (Signed)
Spoke to patient. She had recent medication adjustment and wanted to be sure the BP recorded was OK. I advised that though the bottom number is a little low, this was fine. She is trying to remember to check BP daily and will start to keep a log of readings. She states she was told she probably has "white coat syndrome" and hence her BP was more markedly elevated last time in office (11/15). I noted Wynema BirchHao had added losartan to her regimen at that visit. She reports compliance w/ this. She is having no symptoms or concerns. I advised her to start keeping the daily log of BPs, and to call if she notes any variation of concern regarding home readings or other problems. She voiced understanding of these instructions and thanks for the call.

## 2016-06-23 NOTE — Progress Notes (Deleted)
Cardiology Office Note    Date:  06/23/2016   ID:  Brittany Sawyer, DOB 10-Aug-1940, MRN [id number]  PCP:  [name], MD  Cardiologist:  Dr. Swaziland  No chief complaint on file.   History of Present Illness:  Brittany Sawyer is a 76 y.o. female with PMH of hypothyroidism s/p radioactive iodine thyroid ablation, GERD and hyperlipidemia. She was seen on 05/03/2015 for evaluation of chest pain. She has no history of hypertension and diabetes. She does have family history of CAD. She has been intolerant to Lipitor due to joint aches. Given the exertional nature of her chest discomfort, a stress Myoview was obtained on 03/03/2015 which showed EF 66%, 2 mm ST depression noted in II, III, aVF, V5 and V6 leads that began at 4 minutes into the stress, otherwise normal perfusion, overall consider low risk study however cannot rule out balanced ischemia given positive GXT. She also had hypertensive BP response during the stress test, she was placed on trial of medical therapy with metoprolol 25 mg twice a day and aspirin daily. Unfortunately she could not tolerate metoprolol as it causes her to have hot flushes. She was later placed on lisinopril. Crestor 5 mg every other day was recommended given her prior intolerance to Lipitor.  She was seen  in the clinic on 03/23/2016. Complained of hot flashes on metoprolol and cough on lisinopril. Started on Imdur empirically for exertional chest discomfort.   Echocardiogram obtained on 04/09/2016 showed EF 65-70%, grade 1 diastolic dysfunction, mild TR.  When seen in November she was doing better and was started on losartan. Encouraged to resume Crestor. Consideration for further evaluation if chest pain returned.     Past Medical History:  Diagnosis Date  . GERD (gastroesophageal reflux disease)   . Hyperlipidemia   . Hypothyroid   . S/P radioactive iodine thyroid ablation     Past Surgical History:  Procedure Laterality Date  . right ankle fracture  surgery      Current Medications: Outpatient Medications Prior to Visit  Medication Sig Dispense Refill  . isosorbide mononitrate (IMDUR) 30 MG 24 hr tablet Take 1 tablet (30 mg total) by mouth daily. 30 tablet 9  . levothyroxine (SYNTHROID, LEVOTHROID) 125 MCG tablet Take 125 mcg by mouth daily before breakfast.    . losartan (COZAAR) 25 MG tablet Take 1 tablet (25 mg total) by mouth daily. 90 tablet 3  . PROTONIX 40 MG tablet Take 1 tablet by mouth daily.    . rosuvastatin (CRESTOR) 5 MG tablet Take 1 tablet (5 mg total) by mouth every other day. (Patient not taking: Reported on 04/25/2016) 30 tablet 11  . timolol (TIMOPTIC) 0.5 % ophthalmic solution Place 1 drop into both eyes daily.    . TRAVATAN Z 0.004 % SOLN ophthalmic solution Place 1 drop into both eyes daily.     No facility-administered medications prior to visit.      Allergies:   2,4-d dimethylamine (amisol); Metoprolol; Amoxicillin; Aspirin; Iohexol; and Sulfa antibiotics   Social History   Social History  . Marital status: Married    Spouse name: N/A  . Number of children: 2  . Years of education: N/A   Social History Main Topics  . Smoking status: Former Smoker    Packs/day: 0.50    Years: 30.00    Types: Cigarettes  . Smokeless tobacco: Never Used  . Alcohol use Not on file  . Drug use: Unknown  . Sexual activity: Not on file  Other Topics Concern  . Not on file   Social History Narrative  . No narrative on file     Family History:  The patient's family history includes Cancer in her mother; Heart attack in her father; Heart disease in her brother and sister; Other in her daughter, sister, and son.   ROS:   Please see the history of present illness.    ROS All other systems reviewed and are negative.   PHYSICAL EXAM:   VS:  There were no vitals taken for this visit.   GEN: Well nourished, well developed, in no acute distress  HEENT: normal  Neck: no JVD, carotid bruits, or masses Cardiac: RRR;  no murmurs, rubs, or gallops,no edema  Respiratory:  clear to auscultation bilaterally, normal work of breathing GI: soft, nontender, nondistended, + BS MS: no deformity or atrophy  Skin: warm and dry, no rash Neuro:  Alert and Oriented x 3, Strength and sensation are intact Psych: euthymic mood, full affect  Wt Readings from Last 3 Encounters:  03/23/16 131 lb (59.4 kg)  05/03/15 133 lb (60.3 kg)  03/03/15 134 lb (60.8 kg)      Studies/Labs Reviewed:   EKG:  EKG is not ordered today.   Recent Labs: No results found for requested labs within last 8760 hours.   Lipid Panel No results found for: CHOL, TRIG, HDL, CHOLHDL, VLDL, LDLCALC, LDLDIRECT  Additional studies/ records that were reviewed today include:   Labs from primary care 02/16/16: normal CBC and CMET except glucose 128. Cholesterol 202, triglycerides 109, HDL 55, LDL 125.  Myoview 03/03/2015 Study Highlights    The left ventricular ejection fraction is normal (55-65%).  Nuclear stress EF: 66%.  ST segment depression of 2 mm was noted during stress in the II, aVF, III, V5 and V6 leads, beginning at 4 minutes of stress.  T wave inversion was noted during stress.  The study is normal.  This is a low risk study.  1. Nl Perfusion and EF 2.Positive GXT 3. Low risk study. Can not R/O balanced ischemia      Echo 04/09/2016 LV EF: 65% -   70%  - Left ventricle: The cavity size was normal. Systolic function was   vigorous. The estimated ejection fraction was in the range of 65%   to 70%. Wall motion was normal; there were no regional wall   motion abnormalities. Doppler parameters are consistent with   abnormal left ventricular relaxation (grade 1 diastolic   dysfunction). Doppler parameters are consistent with elevated   ventricular end-diastolic filling pressure. - Aortic valve: Trileaflet; normal thickness leaflets. There was no   regurgitation. - Aortic root: The aortic root was normal in size. -  Ascending aorta: The ascending aorta was normal in size. - Mitral valve: Calcified annulus. Mildly thickened leaflets .   There was trivial regurgitation. - Left atrium: The atrium was normal in size. - Right ventricle: Systolic function was normal. - Tricuspid valve: There was mild regurgitation. - Pulmonic valve: There was no regurgitation. - Pulmonary arteries: Systolic pressure was within the normal   range. - Inferior vena cava: The vessel was normal in size. - Pericardium, extracardiac: There was no pericardial effusion.   ASSESSMENT:    No diagnosis found.   PLAN:  In order of problems listed above:  1. Exertional chest pain: Improved with Imdur, no further chest discomfort except when she is emotionally stressed. Will continue on medical therapy for now unless chest discomfort come back. Low threshold  for cardiac catheterization if she started having chest discomfort with exertion again  2. HLD: She has not started her on her Crestor, I encouraged her to start today.  3. Hypothyroidism: on Synthroid    Medication Adjustments/Labs and Tests Ordered: Current medicines are reviewed at length with the patient today.  Concerns regarding medicines are outlined above.  Medication changes, Labs and Tests ordered today are listed in the Patient Instructions below. There are no Patient Instructions on file for this visit.   Signed, Shaquil Aldana [place], MD  06/23/2016 8:21 PM    [facility] [address] [place], [place], [place]  [address] Phone: [phone]; Fax: [phone]

## 2016-06-28 ENCOUNTER — Ambulatory Visit: Payer: Federal, State, Local not specified - PPO | Admitting: Cardiology

## 2016-07-29 NOTE — Progress Notes (Signed)
Cardiology Office Note    Date:  07/31/2016   ID:  ALEYDA GINDLESPERGER, DOB Oct 31, 1940, MRN [id number]  PCP:  [name], MD  Cardiologist:  Dr. Swaziland  Chief Complaint  Patient presents with  . Follow-up  . Hypertension  . Chest Pain    History of Present Illness:  Brittany Sawyer is a 76 y.o. female with PMH of hypothyroidism s/p radioactive iodine thyroid ablation, GERD and hyperlipidemia. She was last seen  on 05/03/2015 for evaluation of chest pain. She has no history of hypertension and diabetes. She does have family history of CAD. She has been intolerant to Lipitor due to joint aches. Given the exertional nature of her chest discomfort, a stress Myoview was obtained on 03/03/2015 which showed EF 66%, 2 mm ST depression noted in II, III, aVF, V5 and V6 leads that began at 4 minutes into the stress, otherwise normal perfusion, overall consider low risk study however cannot rule out balanced ischemia given positive GXT. She also had hypertensive BP response during the stress test, she was placed on trial of medical therapy with metoprolol 25 mg twice a day and aspirin daily. Unfortunately she could not tolerate metoprolol as it causes her to have hot flushes. She was later placed on lisinopril. Crestor 5 mg every other day was recommended given her prior intolerance to Lipitor.  She was seen  on 03/23/2016 by Azalee Course PA. . It was uncertain what was causing her hot flushes, whether it was Crestor or metoprolol or could be something else. However she thought it was the metoprolol. She developed a cough and nasal congestion on lisinopril and this was stopped. Imdur was added for chest pain. It was recommended  to consider starting Crestor 5 mg every other day. Echocardiogram obtained on 04/09/2016 showed EF 65-70%, grade 1 diastolic dysfunction, mild TR. She was later started on losartan 25 mg daily.  She presents today for follow up. She reports she is doing well. Still has intermittent hot  flashes. Notes mild lightheadedness which she attributes to her medication. Sometimes when she is really going fast she develops chest pressure that resolves with rest. It only occurs about every 2 weeks. Reports BP at home 124 systolic. She is taking Crestor every other day.   Past Medical History:  Diagnosis Date  . GERD (gastroesophageal reflux disease)   . Hyperlipidemia   . Hypothyroid   . S/P radioactive iodine thyroid ablation     Past Surgical History:  Procedure Laterality Date  . right ankle fracture surgery      Current Medications: Outpatient Medications Prior to Visit  Medication Sig Dispense Refill  . levothyroxine (SYNTHROID, LEVOTHROID) 125 MCG tablet Take 125 mcg by mouth daily before breakfast.    . PROTONIX 40 MG tablet Take 1 tablet by mouth daily.    . TRAVATAN Z 0.004 % SOLN ophthalmic solution Place 1 drop into both eyes daily.    . isosorbide mononitrate (IMDUR) 30 MG 24 hr tablet Take 1 tablet (30 mg total) by mouth daily. 30 tablet 9  . losartan (COZAAR) 25 MG tablet Take 1 tablet (25 mg total) by mouth daily. 90 tablet 3  . rosuvastatin (CRESTOR) 5 MG tablet Take 1 tablet (5 mg total) by mouth every other day. (Patient not taking: Reported on 04/25/2016) 30 tablet 11  . timolol (TIMOPTIC) 0.5 % ophthalmic solution Place 1 drop into both eyes daily.     No facility-administered medications prior to visit.  Allergies:   2,4-d dimethylamine (amisol); Metoprolol; Amoxicillin; Aspirin; Iohexol; and Sulfa antibiotics   Social History   Social History  . Marital status: Married    Spouse name: N/A  . Number of children: 2  . Years of education: N/A   Social History Main Topics  . Smoking status: Former Smoker    Packs/day: 0.50    Years: 30.00    Types: Cigarettes  . Smokeless tobacco: Never Used  . Alcohol use No  . Drug use: No  . Sexual activity: Not Asked   Other Topics Concern  . None   Social History Narrative  . None     Family  History:  The patient's family history includes Cancer in her mother; Heart attack in her father; Heart disease in her brother and sister; Other in her daughter, sister, and son.   ROS:   Please see the history of present illness.    ROS All other systems reviewed and are negative.   PHYSICAL EXAM:   VS:  BP (!) 140/58 (BP Location: Right Arm, Patient Position: Sitting, Cuff Size: Normal)   Pulse 64   Ht 5' (1.524 m)   Wt 135 lb (61.2 kg)   BMI 26.37 kg/m    GEN: Well nourished, well developed, in no acute distress  HEENT: normal  Neck: no JVD, carotid bruits, or masses Cardiac: RRR; no murmurs, rubs, or gallops,no edema  Respiratory:  clear to auscultation bilaterally, normal work of breathing GI: soft, nontender, nondistended, + BS MS: no deformity or atrophy  Skin: warm and dry, no rash Neuro:  Alert and Oriented x 3, Strength and sensation are intact Psych: euthymic mood, full affect  Wt Readings from Last 3 Encounters:  07/31/16 135 lb (61.2 kg)  03/23/16 131 lb (59.4 kg)  05/03/15 133 lb (60.3 kg)      Studies/Labs Reviewed:   EKG:  EKG is not ordered today.   Recent Labs: No results found for requested labs within last 8760 hours.   Lipid Panel No results found for: CHOL, TRIG, HDL, CHOLHDL, VLDL, LDLCALC, LDLDIRECT  Additional studies/ records that were reviewed today include:   Labs dated 02/16/16: Cholesterol 202, triglycerides 109, HDL 55, LDL 125, glucose 128. Other chemistries and CBC normal. Last TSH normal.  Myoview 03/03/2015 Study Highlights    The left ventricular ejection fraction is normal (55-65%).  Nuclear stress EF: 66%.  ST segment depression of 2 mm was noted during stress in the II, aVF, III, V5 and V6 leads, beginning at 4 minutes of stress.  T wave inversion was noted during stress.  The study is normal.  This is a low risk study.  1. Nl Perfusion and EF 2.Positive GXT 3. Low risk study. Can not R/O balanced ischemia       Echo 04/09/2016 LV EF: 65% -   70%  - Left ventricle: The cavity size was normal. Systolic function was   vigorous. The estimated ejection fraction was in the range of 65%   to 70%. Wall motion was normal; there were no regional wall   motion abnormalities. Doppler parameters are consistent with   abnormal left ventricular relaxation (grade 1 diastolic   dysfunction). Doppler parameters are consistent with elevated   ventricular end-diastolic filling pressure. - Aortic valve: Trileaflet; normal thickness leaflets. There was no   regurgitation. - Aortic root: The aortic root was normal in size. - Ascending aorta: The ascending aorta was normal in size. - Mitral valve: Calcified annulus. Mildly thickened  leaflets .   There was trivial regurgitation. - Left atrium: The atrium was normal in size. - Right ventricle: Systolic function was normal. - Tricuspid valve: There was mild regurgitation. - Pulmonic valve: There was no regurgitation. - Pulmonary arteries: Systolic pressure was within the normal   range. - Inferior vena cava: The vessel was normal in size. - Pericardium, extracardiac: There was no pericardial effusion.   ASSESSMENT:    1. Exertional chest pain   2. Hyperlipidemia, unspecified hyperlipidemia type   3. RBBB      PLAN:  In order of problems listed above:  1. Exertional chest pain: Improved with Imdur, symptoms are stable.  Will continue on medical therapy for now unless chest discomfort increases in frequency or severity.  Low threshold for cardiac catheterization if symptoms increase.  2. HLD: on low dose Crestor. Plan follow up lab work with Dr. Cyndia Bent.   3. Hypothyroidism: on Synthroid    Medication Adjustments/Labs and Tests Ordered: Current medicines are reviewed at length with the patient today.  Concerns regarding medicines are outlined above.  Medication changes, Labs and Tests ordered today are listed in the Patient Instructions  below. Patient Instructions  Continue your current therapy  Increase your activity.  If you note any increase in your chest discomfort let me know  I will see you in 6 months.      Signed, Peri Kreft Swaziland, MD  07/31/2016 9:49 AM    [facility] [address] [place], [place], [place]  [address] Phone: [phone]; Fax: [phone]

## 2016-07-30 ENCOUNTER — Encounter: Payer: Self-pay | Admitting: Cardiology

## 2016-07-31 ENCOUNTER — Encounter: Payer: Self-pay | Admitting: Cardiology

## 2016-07-31 ENCOUNTER — Ambulatory Visit (INDEPENDENT_AMBULATORY_CARE_PROVIDER_SITE_OTHER): Payer: Federal, State, Local not specified - PPO | Admitting: Cardiology

## 2016-07-31 VITALS — BP 140/58 | HR 64 | Ht 60.0 in | Wt 135.0 lb

## 2016-07-31 DIAGNOSIS — E785 Hyperlipidemia, unspecified: Secondary | ICD-10-CM

## 2016-07-31 DIAGNOSIS — R079 Chest pain, unspecified: Secondary | ICD-10-CM | POA: Diagnosis not present

## 2016-07-31 DIAGNOSIS — I4510 Unspecified right bundle-branch block: Secondary | ICD-10-CM

## 2016-07-31 NOTE — Patient Instructions (Signed)
Continue your current therapy  Increase your activity.  If you note any increase in your chest discomfort let me know  I will see you in 6 months.

## 2016-10-02 ENCOUNTER — Other Ambulatory Visit: Payer: Self-pay | Admitting: Gastroenterology

## 2016-10-02 DIAGNOSIS — R1013 Epigastric pain: Secondary | ICD-10-CM

## 2016-10-05 ENCOUNTER — Ambulatory Visit
Admission: RE | Admit: 2016-10-05 | Discharge: 2016-10-05 | Disposition: A | Discharge status: Home / Self Care | Payer: Federal, State, Local not specified - PPO | Source: Ambulatory Visit | Attending: Gastroenterology | Admitting: Gastroenterology

## 2016-10-05 DIAGNOSIS — R1013 Epigastric pain: Secondary | ICD-10-CM

## 2016-10-15 ENCOUNTER — Telehealth: Payer: Self-pay | Admitting: Cardiology

## 2016-10-15 NOTE — Telephone Encounter (Signed)
Returned call to patient.Spoke to DOD Dr.Croitoru he advised to stop Isosorbide.Advised to monitor B/P and call back if she has chest pain and if B/P continues to be elevated.

## 2016-10-15 NOTE — Telephone Encounter (Signed)
Pt c/o medication issue:  1. Name of Medication: losartan and isosorbide   2. How are you currently taking this medication (dosage and times per day)? 1xday  3. Are you having a reaction (difficulty breathing--STAT)? no  4. What is your medication issue? lethargic

## 2016-10-15 NOTE — Telephone Encounter (Signed)
Returned call to patient.She stated since she started on Losartan 25 mg daily and Isosorbide 30 mg daily she has felt dizzy and woozy.No chest pain.She has not checked her B/P.Advised to check B/P and I will call you back. Spoke to pt B/P 158/65 pulse 74.Stated she saw [facility] recently and was told she has a hiatal hernia.Advised Dr.Jordan out of office this week.I will speak to DOD Dr.Croitoru and call you back.

## 2017-03-29 ENCOUNTER — Ambulatory Visit: Payer: Federal, State, Local not specified - PPO | Admitting: Cardiology

## 2017-04-05 ENCOUNTER — Other Ambulatory Visit: Payer: Self-pay

## 2017-04-05 MED ORDER — LOSARTAN POTASSIUM 25 MG PO TABS: 25.0000 mg | ORAL_TABLET | Freq: Every day | ORAL | 3 refills | Status: DC

## 2017-04-13 ENCOUNTER — Other Ambulatory Visit: Payer: Self-pay | Admitting: Physician Assistant

## 2017-04-15 NOTE — Telephone Encounter (Signed)
Please review for refill, Thanks !  

## 2017-08-14 ENCOUNTER — Ambulatory Visit: Payer: Federal, State, Local not specified - PPO | Admitting: Physician Assistant

## 2017-08-14 ENCOUNTER — Encounter: Payer: Self-pay | Admitting: Physician Assistant

## 2017-08-14 VITALS — BP 136/66 | HR 80 | Ht 60.0 in | Wt 132.2 lb

## 2017-08-14 DIAGNOSIS — R072 Precordial pain: Secondary | ICD-10-CM | POA: Diagnosis not present

## 2017-08-14 DIAGNOSIS — E039 Hypothyroidism, unspecified: Secondary | ICD-10-CM

## 2017-08-14 DIAGNOSIS — E785 Hyperlipidemia, unspecified: Secondary | ICD-10-CM

## 2017-08-14 MED ORDER — METOPROLOL TARTRATE 50 MG PO TABS: ORAL_TABLET | ORAL | 1 refills | Status: DC

## 2017-08-14 NOTE — Patient Instructions (Addendum)
Schedule Cardiac CT  Take Metoprolol 50 mg 1 hour before Cardiac CT  Have Lab ( bmet ) done within 14 days of Cardiac CT   Start taking Aspirin 81 mg daily     [facility] at     AM (30-45 minutes prior to test start time)  [facility] [address] [place], [place] [address] [phone]  Proceed to the [facility] Radiology Department (First Floor).  Please follow these instructions carefully (unless otherwise directed):  Hold all erectile dysfunction medications at least 48 hours prior to test.  On the Night Before the Test: . Drink plenty of water. . Do not consume any caffeinated/decaffeinated beverages or chocolate 12 hours prior to your test. . Do not take any antihistamines 12 hours prior to your test. . If you take Metformin do not take 24 hours prior to test. . If the patient has contrast allergy: ? Patient will need a prescription for Prednisone and very clear instructions (as follows): 1. Prednisone 50 mg - take 13 hours prior to test 2. Take another Prednisone 50 mg 7 hours prior to test 3. Take another Prednisone 50 mg 1 hour prior to test 4. Take Benadryl 50 mg 1 hour prior to test . Patient must complete all four doses of above prophylactic medications. . Patient will need a ride after test due to Benadryl.  On the Day of the Test: . Drink plenty of water. Do not drink any water within one hour of the test. . Do not eat any food 4 hours prior to the test. . You may take your regular medications prior to the test. . IF NOT ON A BETA BLOCKER - Take 50 mg of lopressor (metoprolol) one hour before the test. . HOLD Furosemide morning of the test.  After the Test: . Drink plenty of water. . After receiving IV contrast, you may experience a mild flushed feeling. This is normal. . On occasion, you may experience a mild rash up to 24 hours after the test. This is not dangerous. If this  occurs, you can take Benadryl 25 mg and increase your fluid intake. . If you experience trouble breathing, this can be serious. If it is severe call 911 IMMEDIATELY. If it is mild, please call our office. . If you take any of these medications: Glipizide/Metformin, Avandament, Glucavance, please do not take 48 hours after completing test.

## 2017-08-14 NOTE — Progress Notes (Signed)
Cardiology Office Note    Date:  08/14/2017   ID:  Brittany Sawyer, DOB 23-Oct-1940, MRN [id number]  PCP:  [name], MD  Cardiologist:  Dr. Swaziland   Chief Complaint  Patient presents with  . Follow-up    seen for Dr. Swaziland    History of Present Illness:  Brittany Sawyer is a 77 y.o. female with PMH of hypothyroidism s/p radioactive iodine thyroid ablation, GERD and hyperlipidemia. She was last seen by Dr. Swaziland on 11/22/2016for evaluation of chest pain. She has no history of hypertension and diabetes. She does have family history of CAD. She has been intolerant to Lipitor due to joint aches. Given the exertional nature of her chest discomfort, a stress Myoview was obtained on 03/03/2015 which showed EF 66%, 2 mm ST depression noted in II, III, aVF, V5 and V6 leads that began at 4 minutes into the stress, otherwise normal perfusion, overall consider low risk study however cannot rule out balanced ischemia given positive GXT. She also had hypertensive BP response during the stress test, she was placed on trial of medical therapy with metoprolol 25 mg twice a day and aspirin daily. Unfortunately she could not tolerate metoprolol as it causes her to have hot flushes. She was later placed on lisinopril. Crestor 5 mg every other day was recommended given her prior intolerance to Lipitor. Echocardiogram obtained on 04/09/2016 showed EF 65-70%, grade 1 diastolic dysfunction, mild TR. I previously discontinue her lisinopril due to cough and started her on Imdur for exertional chest discomfort.  She was last seen by Dr. Swaziland in February 2018 at which time she was doing well.  7-month follow-up was recommended.  She was later started on losartan 25 mg daily.  Isosorbide was discontinued in May 2018 due to dizziness.  Patient presents today for cardiology office visit.  She continued to have intermittent chest discomfort, this is getting more frequent.  She typically experiences at rest.  The  episodes tend to occur quite transiently.  She says the frequency is about 4-5 times a week.  EKG continued to show right bundle branch block however has new T wave inversion in lead II and III.  Her symptom is quite atypical, however given her significant family history of CAD and her risk factors, I recommended coronary CT with FFR to obtain more information.  If coronary CT is negative, she can follow-up in 12 months.  If it shows nonobstructive disease, then we can see her in 6 months, sooner if she has significant coronary artery disease.  Otherwise she has no lower extremity edema, orthopnea or PND.  She will resume aspirin, she has history of GI discomfort on aspirin in the past, however can take enteric-coated aspirin.   Past Medical History:  Diagnosis Date  . GERD (gastroesophageal reflux disease)   . Hyperlipidemia   . Hypothyroid   . S/P radioactive iodine thyroid ablation     Past Surgical History:  Procedure Laterality Date  . right ankle fracture surgery      Current Medications: Outpatient Medications Prior to Visit  Medication Sig Dispense Refill  . ALPHAGAN P 0.1 % SOLN Place 1 drop into both eyes 3 (three) times daily.    Marland Kitchen levothyroxine (SYNTHROID, LEVOTHROID) 125 MCG tablet Take 125 mcg by mouth daily before breakfast.    . losartan (COZAAR) 25 MG tablet Take 1 tablet (25 mg total) by mouth daily. 90 tablet 3  . PROTONIX 40 MG tablet Take 1 tablet by  mouth daily.    . rosuvastatin (CRESTOR) 5 MG tablet TAKE 1 TABLET BY MOUTH EVERY OTHER DAY 30 tablet 4  . ZIOPTAN 0.0015 % SOLN     . TRAVATAN Z 0.004 % SOLN ophthalmic solution Place 1 drop into both eyes daily.    Marland Kitchen aspirin EC 81 MG tablet Take 1 tablet (81 mg total) by mouth daily. 90 tablet 3   No facility-administered medications prior to visit.      Allergies:   2,4-d dimethylamine (amisol); Metoprolol; Amoxicillin; Aspirin; Iohexol; and Sulfa antibiotics   Social History   Socioeconomic History  . Marital  status: Married    Spouse name: None  . Number of children: 2  . Years of education: None  . Highest education level: None  Social Needs  . Financial resource strain: None  . Food insecurity - worry: None  . Food insecurity - inability: None  . Transportation needs - medical: None  . Transportation needs - non-medical: None  Occupational History  . None  Tobacco Use  . Smoking status: Former Smoker    Packs/day: 0.50    Years: 30.00    Pack years: 15.00    Types: Cigarettes  . Smokeless tobacco: Never Used  Substance and Sexual Activity  . Alcohol use: No    Alcohol/week: 0.0 oz  . Drug use: No  . Sexual activity: None  Other Topics Concern  . None  Social History Narrative  . None     Family History:  The patient's family history includes Cancer in her mother; Heart attack in her father; Heart disease in her brother and sister; Other in her daughter, sister, and son.   ROS:   Please see the history of present illness.    ROS All other systems reviewed and are negative.   PHYSICAL EXAM:   VS:  BP 136/66   Pulse 80   Ht 5' (1.524 m)   Wt 132 lb 3.2 oz (60 kg)   BMI 25.82 kg/m    GEN: Well nourished, well developed, in no acute distress  HEENT: normal  Neck: no JVD, carotid bruits, or masses Cardiac: RRR; no murmurs, rubs, or gallops,no edema  Respiratory:  clear to auscultation bilaterally, normal work of breathing GI: soft, nontender, nondistended, + BS MS: no deformity or atrophy  Skin: warm and dry, no rash Neuro:  Alert and Oriented x 3, Strength and sensation are intact Psych: euthymic mood, full affect  Wt Readings from Last 3 Encounters:  08/14/17 132 lb 3.2 oz (60 kg)  07/31/16 135 lb (61.2 kg)  03/23/16 131 lb (59.4 kg)      Studies/Labs Reviewed:   EKG:  EKG is ordered today.  The ekg ordered today demonstrates normal sinus rhythm, right bundle branch block, new T wave inversion in lead II and III compared to previous EKG.  Recent Labs: No  results found for requested labs within last 8760 hours.   Lipid Panel No results found for: CHOL, TRIG, HDL, CHOLHDL, VLDL, LDLCALC, LDLDIRECT  Additional studies/ records that were reviewed today include:   Myoview 03/03/2015 Study Highlights    The left ventricular ejection fraction is normal (55-65%).  Nuclear stress EF: 66%.  ST segment depression of 2 mm was noted during stress in the II, aVF, III, V5 and V6 leads, beginning at 4 minutes of stress.  T wave inversion was noted during stress.  The study is normal.  This is a low risk study.   1. Nl Perfusion and  EF 2.Positive GXT 3. Low risk study. Can not R/O balanced ischemia      Echo 04/09/2016 LV EF: 65% -   70% Study Conclusions  - Left ventricle: The cavity size was normal. Systolic function was   vigorous. The estimated ejection fraction was in the range of 65%   to 70%. Wall motion was normal; there were no regional wall   motion abnormalities. Doppler parameters are consistent with   abnormal left ventricular relaxation (grade 1 diastolic   dysfunction). Doppler parameters are consistent with elevated   ventricular end-diastolic filling pressure. - Aortic valve: Trileaflet; normal thickness leaflets. There was no   regurgitation. - Aortic root: The aortic root was normal in size. - Ascending aorta: The ascending aorta was normal in size. - Mitral valve: Calcified annulus. Mildly thickened leaflets .   There was trivial regurgitation. - Left atrium: The atrium was normal in size. - Right ventricle: Systolic function was normal. - Tricuspid valve: There was mild regurgitation. - Pulmonic valve: There was no regurgitation. - Pulmonary arteries: Systolic pressure was within the normal   range. - Inferior vena cava: The vessel was normal in size. - Pericardium, extracardiac: There was no pericardial effusion.   ASSESSMENT:    1. Precordial pain   2. Hypothyroidism, unspecified type   3.  Hyperlipidemia, unspecified hyperlipidemia type      PLAN:  In order of problems listed above:  1. Precordial chest pain: Her chest pain seems to be quite atypical however getting more frequent since our last office visit.  She had somewhat abnormal stress test in 2016, perfusion portion was normal at the time, however treadmill portion was positive with significant ST depression in inferolateral leads.  She has been managed medically since.  Given increasing frequency of her symptoms, I recommended a coronary CT with FFR  2. Hypothyroidism: Managed by primary care provider  3. Hyperlipidemia: On Crestor 5 mg daily.  Based on outside lab from [facility], her cholesterol seems to be quite well controlled on current medication.  She is due for repeat lipid panel this year.  We will defer this to her primary care provider.    Medication Adjustments/Labs and Tests Ordered: Current medicines are reviewed at length with the patient today.  Concerns regarding medicines are outlined above.  Medication changes, Labs and Tests ordered today are listed in the Patient Instructions below. Patient Instructions  Schedule Cardiac CT  Take Metoprolol 50 mg 1 hour before Cardiac CT  Have Lab ( bmet ) done within 14 days of Cardiac CT   Start taking Aspirin 81 mg daily     Please [facility] the [facility] of [facility] at     AM (30-45 minutes prior to test start time)  [facility] [address] [place], [place] [address] [phone]  Proceed to the [facility] Radiology Department (First Floor).  Please follow these instructions carefully (unless otherwise directed):  Hold all erectile dysfunction medications at least 48 hours prior to test.  On the Night Before the Test: . Drink plenty of water. . Do not consume any caffeinated/decaffeinated beverages or chocolate 12 hours prior to your test. . Do not take any antihistamines 12 hours prior to your  test. . If you take Metformin do not take 24 hours prior to test. . If the patient has contrast allergy: ? Patient will need a prescription for Prednisone and very clear instructions (as follows): 1. Prednisone 50 mg - take 13  hours prior to test 2. Take another Prednisone 50 mg 7 hours prior to test 3. Take another Prednisone 50 mg 1 hour prior to test 4. Take Benadryl 50 mg 1 hour prior to test . Patient must complete all four doses of above prophylactic medications. . Patient will need a ride after test due to Benadryl.  On the Day of the Test: . Drink plenty of water. Do not drink any water within one hour of the test. . Do not eat any food 4 hours prior to the test. . You may take your regular medications prior to the test. . IF NOT ON A BETA BLOCKER - Take 50 mg of lopressor (metoprolol) one hour before the test. . HOLD Furosemide morning of the test.  After the Test: . Drink plenty of water. . After receiving IV contrast, you may experience a mild flushed feeling. This is normal. . On occasion, you may experience a mild rash up to 24 hours after the test. This is not dangerous. If this occurs, you can take Benadryl 25 mg and increase your fluid intake. . If you experience trouble breathing, this can be serious. If it is severe call 911 IMMEDIATELY. If it is mild, please call our office. . If you take any of these medications: Glipizide/Metformin, Avandament, Glucavance, please do not take 48 hours after completing test.        Signed, Azalee Course, PA  08/14/2017 5:30 PM    [facility] [address] [place], [place], [place]  [address] Phone: [phone]; Fax: [phone]

## 2017-09-06 ENCOUNTER — Ambulatory Visit: Payer: Federal, State, Local not specified - PPO | Admitting: Cardiology

## 2017-09-09 ENCOUNTER — Telehealth: Payer: Self-pay | Admitting: Cardiology

## 2017-09-09 NOTE — Telephone Encounter (Signed)
New message    Pt c/o BP issue:  1. What are your last 5 BP readings? 131/54, 133/55 2. Are you having any other symptoms (ex. Dizziness, headache, blurred vision, passed out)? dizziness 3. What is your medication issue? Feels BP too low

## 2017-09-09 NOTE — Telephone Encounter (Signed)
Spoke with pt and informed pt that these b/p are good continue to monitor.Pt does note sinus pressure as well as had small amount of fluid  in ear . Pt will try taking Claritin and see if helps will forward to Dr SwazilandJordan for review and any recommendations .

## 2017-09-09 NOTE — Telephone Encounter (Signed)
Left voice mail that Dr SwazilandJordan agrees  B/p look good ./cy

## 2017-09-09 NOTE — Telephone Encounter (Signed)
Agree that BP looks good and is not the issue  Ijeoma Loor Sw[place]andJordan MD, [facility]

## 2017-10-14 ENCOUNTER — Telehealth: Payer: Self-pay | Admitting: Physician Assistant

## 2017-10-14 NOTE — Telephone Encounter (Signed)
New Message     Patient wants to know if she needs to take the prednisone the night before her Ct, she is not allergic to the contrast dye? Also she can not take the  of aspirin due to stomach issues.  Please call before 130pm,   Pt having CT 11/05/17 930a

## 2017-10-14 NOTE — Telephone Encounter (Signed)
Follow up    Pt c/o medication issue:  1. Name of Medication:  metoprolol tartrate (LOPRESSOR) 50 MG tablet Take 50 mg 1 hour before cardiac ct       2. How are you currently taking this medication (dosage and times per day)?  Has not taken yet but is on the patients instructions  3. Are you having a reaction (difficulty breathing--STAT)? Under allergies this is listed   4. What is your medication issue? Should patient take this medication , with it stating on her chart that she has an allergy to it?

## 2017-10-14 NOTE — Telephone Encounter (Signed)
Returned the call to the patient. She stated that her instructions for the cardiac ct stated for her to take prednisone before the test. She stated that she does not have a contrast dye allergy and prednisone was not prescribed. She has been informed to please disregard that part of the instructions.   She also stated that she does not remember ever taking Metoprolol before even though it states that she has an allergy to this medication. She stated that she is not sure why that was put on her allergy list because, again, she does not remember ever taking it prior.

## 2017-11-01 ENCOUNTER — Other Ambulatory Visit: Payer: Federal, State, Local not specified - PPO

## 2017-11-01 LAB — BASIC METABOLIC PANEL
BUN/Creatinine Ratio: 22 (ref 12–28)
BUN: 17 mg/dL (ref 8–27)
CALCIUM: 9.3 mg/dL (ref 8.7–10.3)
CO2: 22 mmol/L (ref 20–29)
CREATININE: 0.79 mg/dL (ref 0.57–1.00)
Chloride: 103 mmol/L (ref 96–106)
GFR calc Af Amer: 84 mL/min/{1.73_m2} (ref >59)
GFR, EST NON AFRICAN AMERICAN: 73 mL/min/{1.73_m2} (ref >59)
GLUCOSE: 131 mg/dL — AB (ref 65–99)
Potassium: 4.8 mmol/L (ref 3.5–5.2)
SODIUM: 139 mmol/L (ref 134–144)

## 2017-11-07 ENCOUNTER — Ambulatory Visit (HOSPITAL_COMMUNITY): Payer: Federal, State, Local not specified - PPO

## 2017-11-07 ENCOUNTER — Encounter (HOSPITAL_COMMUNITY): Payer: Self-pay

## 2017-11-07 ENCOUNTER — Ambulatory Visit (HOSPITAL_COMMUNITY)
Admission: RE | Admit: 2017-11-07 | Discharge: 2017-11-07 | Disposition: A | Discharge status: Home / Self Care | Payer: Federal, State, Local not specified - PPO | Source: Ambulatory Visit | Attending: Physician Assistant | Admitting: Physician Assistant

## 2017-11-07 ENCOUNTER — Telehealth: Payer: Self-pay | Admitting: Cardiology

## 2017-11-07 DIAGNOSIS — R072 Precordial pain: Secondary | ICD-10-CM

## 2017-11-07 NOTE — Telephone Encounter (Signed)
Returned call to husband (ok per DPR)-he is very frustrated that patient went to have her coronary CTA today and they could not do it because she was not premedicated for dye allergy.  He states this happened 12 years ago and they do not remember this.   He is really upset and needs this rescheduled and premedication for the allergy before he has his knee surgery in 3 weeks.     Advised I would route to primary nurse for medication and will need scheduler to reschedule this test.

## 2017-11-07 NOTE — Telephone Encounter (Signed)
New Message  Pt's husband is calling because he took the pt to her ct appt and they said they could not do the procedure due to her having an allergic reaction to the dye 12 years ago. Please call

## 2017-11-08 ENCOUNTER — Other Ambulatory Visit: Payer: Self-pay

## 2017-11-08 MED ORDER — DIPHENHYDRAMINE HCL 50 MG PO CAPS: ORAL_CAPSULE | ORAL | 0 refills | Status: DC

## 2017-11-08 MED ORDER — PREDNISONE 50 MG PO TABS: ORAL_TABLET | ORAL | 0 refills | Status: DC

## 2017-11-08 NOTE — Telephone Encounter (Signed)
Pt calling to follow up on the issue of pt being pre medicated for her CT  Please give him a call back today.

## 2017-11-11 NOTE — Telephone Encounter (Signed)
Spoke to patient's husband Fri 5/31.Advised wife is to take prednisone 50 mg 13 hours before coronary ct, 50 mg 7 hours before, and then 50 mg with a 50 mg benadryl just before leaving home.

## 2017-11-26 ENCOUNTER — Ambulatory Visit (HOSPITAL_COMMUNITY): Payer: Federal, State, Local not specified - PPO

## 2018-05-21 ENCOUNTER — Other Ambulatory Visit: Payer: Self-pay | Admitting: Cardiology

## 2018-07-18 NOTE — Progress Notes (Signed)
Cardiology Office Note    Date:  07/22/2018   ID:  Brittany Sawyer, DOB 10-31-1940, MRN [id number]  PCP:  Brittany Inch, MD  Cardiologist:  Dr. Swaziland  Chief Complaint  Patient presents with  . Hypertension    History of Present Illness:  Brittany Sawyer is a 78 y.o. female with PMH of hypothyroidism s/p radioactive iodine thyroid ablation, GERD and hyperlipidemia. She was last seen  on 05/03/2015 for evaluation of chest pain. She has no history of hypertension and diabetes. She does have family history of CAD. She has been intolerant to Lipitor due to joint aches. Given the exertional nature of her chest discomfort, a stress Myoview was obtained on 03/03/2015 which showed EF 66%, 2 mm ST depression noted in II, III, aVF, V5 and V6 leads that began at 4 minutes into the stress, otherwise normal perfusion, overall consider low risk study however cannot rule out balanced ischemia given positive Ecg changes. She also had hypertensive BP response during the stress test, she was placed on trial of medical therapy with metoprolol 25 mg twice a day and aspirin daily. Unfortunately she could not tolerate metoprolol as it causes her to have hot flushes. She was later placed on lisinopril. Crestor 5 mg every other day was recommended given her prior intolerance to Lipitor.  She was seen on 03/23/2016.  She developed a cough and nasal congestion on lisinopril and this was stopped. Imdur was added for chest pain. It was recommended  to consider starting Crestor 5 mg every other day. Echocardiogram obtained on 04/09/2016 showed EF 65-70%, grade 1 diastolic dysfunction, mild TR. She was later started on losartan 25 mg daily.  When last seen in March 2019 she had some intermittent chest pain. A coronary CT was ordered. There appears to be some confusion about her being pretreated for contrast allergy and the result is that this was never done. She states she had lost a lot of weight at that time and since she  gained weight she has no further chest pain. It was listed in our record that she had hives related to contrast dye. She denies ever having this and has since had a CT head with contrast without any reaction.  On follow up today she has had eye problems with surgery for a cataract and glaucoma. She is having continuous dizziness throughout the day. Better at night. No HA. CT head was unremarkable. Denies any chest pain or dyspnea now.    Past Medical History:  Diagnosis Date  . GERD (gastroesophageal reflux disease)   . Hyperlipidemia   . Hypothyroid   . S/P radioactive iodine thyroid ablation     Past Surgical History:  Procedure Laterality Date  . right ankle fracture surgery      Current Medications: Outpatient Medications Prior to Visit  Medication Sig Dispense Refill  . ALPHAGAN P 0.1 % SOLN Place 1 drop into both eyes 3 (three) times daily.    Marland Kitchen aspirin EC 81 MG tablet Take 1 tablet (81 mg total) by mouth daily. 90 tablet 3  . diphenhydrAMINE (BENADRYL) 50 MG capsule Take 50 mg along with last dose of prednisone before leaving home for coronary ct 1 capsule 0  . levothyroxine (SYNTHROID, LEVOTHROID) 125 MCG tablet Take 125 mcg by mouth daily before breakfast.    . predniSONE (DELTASONE) 50 MG tablet Take 50 mg 13 hours before coronary ct and 50 mg 7 hours before and 50 mg with a 50 mg Benadryl  before leaving home for coronary ct 3 tablet 0  . PROTONIX 40 MG tablet Take 1 tablet by mouth daily.    Marland Kitchen ZIOPTAN 0.0015 % SOLN One drop each eye at bed time    . losartan (COZAAR) 25 MG tablet TAKE 1 TABLET BY MOUTH EVERY DAY 90 tablet 3  . metoprolol tartrate (LOPRESSOR) 50 MG tablet Take 50 mg 1 hour before cardiac ct 1 tablet 1  . rosuvastatin (CRESTOR) 5 MG tablet TAKE 1 TABLET BY MOUTH EVERY OTHER DAY 30 tablet 4   No facility-administered medications prior to visit.      Allergies:   2,4-d dimethylamine (amisol); Metoprolol; Rosuvastatin; Amoxicillin; Aspirin; and Sulfa  antibiotics   Social History   Socioeconomic History  . Marital status: Married    Spouse name: Not on file  . Number of children: 2  . Years of education: Not on file  . Highest education level: Not on file  Occupational History  . Not on file  Social Needs  . Financial resource strain: Not on file  . Food insecurity:    Worry: Not on file    Inability: Not on file  . Transportation needs:    Medical: Not on file    Non-medical: Not on file  Tobacco Use  . Smoking status: Former Smoker    Packs/day: 0.50    Years: 30.00    Pack years: 15.00    Types: Cigarettes  . Smokeless tobacco: Never Used  Substance and Sexual Activity  . Alcohol use: No    Alcohol/week: 0.0 standard drinks  . Drug use: No  . Sexual activity: Not on file  Lifestyle  . Physical activity:    Days per week: Not on file    Minutes per session: Not on file  . Stress: Not on file  Relationships  . Social connections:    Talks on phone: Not on file    Gets together: Not on file    Attends religious service: Not on file    Active member of club or organization: Not on file    Attends meetings of clubs or organizations: Not on file    Relationship status: Not on file  Other Topics Concern  . Not on file  Social History Narrative  . Not on file     Family History:  The patient's family history includes Cancer in her mother; Heart attack in her father; Heart disease in her brother and sister; Other in her daughter, sister, and son.   ROS:   Please see the history of present illness.    ROS All other systems reviewed and are negative.   PHYSICAL EXAM:   VS:  BP (!) 142/60   Pulse 69   Ht 5' (1.524 m)   Wt 143 lb 3.2 oz (65 kg)   BMI 27.97 kg/m    GENERAL:  Well appearing WF in NAD HEENT:  PERRL, EOMI, sclera are clear. Oropharynx is clear. NECK:  No jugular venous distention, carotid upstroke brisk and symmetric, no bruits, no thyromegaly or adenopathy LUNGS:  Clear to auscultation  bilaterally CHEST:  Unremarkable HEART:  RRR,  PMI not displaced or sustained,S1 and S2 within normal limits, no S3, no S4: no clicks, no rubs, no murmurs ABD:  Soft, nontender. BS +, no masses or bruits. No hepatomegaly, no splenomegaly EXT:  2 + pulses throughout, no edema, no cyanosis no clubbing SKIN:  Warm and dry.  No rashes NEURO:  Alert and oriented x 3. Cranial  nerves II through XII intact. PSYCH:  Cognitively intact    Wt Readings from Last 3 Encounters:  07/22/18 143 lb 3.2 oz (65 kg)  08/14/17 132 lb 3.2 oz (60 kg)  07/31/16 135 lb (61.2 kg)      Studies/Labs Reviewed:   EKG:  EKG is ordered today. NSR with RBBB. Otherwise normal. I have personally reviewed and interpreted this study.   Recent Labs: 11/01/2017: BUN 17; Creatinine, Ser 0.79; Potassium 4.8; Sodium 139   Lipid Panel No results found for: CHOL, TRIG, HDL, CHOLHDL, VLDL, LDLCALC, LDLDIRECT  Additional studies/ records that were reviewed today include:   Labs dated 02/16/16: Cholesterol 202, triglycerides 109, HDL 55, LDL 125, glucose 128. Other chemistries and CBC normal. Last TSH normal. Dated 08/26/17: CMET, CBC normal. A1c 6.2%. cholesterol 124, triglycerides 87, HDL 50, LDL 57.   Myoview 03/03/2015 Study Highlights    The left ventricular ejection fraction is normal (55-65%).  Nuclear stress EF: 66%.  ST segment depression of 2 mm was noted during stress in the II, aVF, III, V5 and V6 leads, beginning at 4 minutes of stress.  T wave inversion was noted during stress.  The study is normal.  This is a low risk study.  1. Nl Perfusion and EF 2.Positive GXT 3. Low risk study. Can not R/O balanced ischemia      Echo 04/09/2016 LV EF: 65% -   70%  - Left ventricle: The cavity size was normal. Systolic function was   vigorous. The estimated ejection fraction was in the range of 65%   to 70%. Wall motion was normal; there were no regional wall   motion abnormalities. Doppler parameters  are consistent with   abnormal left ventricular relaxation (grade 1 diastolic   dysfunction). Doppler parameters are consistent with elevated   ventricular end-diastolic filling pressure. - Aortic valve: Trileaflet; normal thickness leaflets. There was no   regurgitation. - Aortic root: The aortic root was normal in size. - Ascending aorta: The ascending aorta was normal in size. - Mitral valve: Calcified annulus. Mildly thickened leaflets .   There was trivial regurgitation. - Left atrium: The atrium was normal in size. - Right ventricle: Systolic function was normal. - Tricuspid valve: There was mild regurgitation. - Pulmonic valve: There was no regurgitation. - Pulmonary arteries: Systolic pressure was within the normal   range. - Inferior vena cava: The vessel was normal in size. - Pericardium, extracardiac: There was no pericardial effusion.   ASSESSMENT:    1. Hyperlipidemia, unspecified hyperlipidemia type   2. RBBB   3. Essential hypertension      PLAN:  In order of problems listed above:  1. HTN well controlled. Refilled losartan  2. HLD: on low dose Crestor. Plan follow up lab work with Dr. Cyndia BentBadger this spring.   3. Hypothyroidism: on Synthroid  4.   History of atypical chest pain- resolved. If symptoms recur could reconsider cardiac CT. Able to tolerate contrast without pretreatment.     Medication Adjustments/Labs and Tests Ordered: Current medicines are reviewed at length with the patient today.  Concerns regarding medicines are outlined above.  Medication changes, Labs and Tests ordered today are listed in the Patient Instructions below. There are no Patient Instructions on file for this visit.   Signed, Peter SwazilandJordan, MD  07/22/2018 11:00 AM    [facility] [address], [place], [place]  [address] Phone: [phone]; Fax: [phone]

## 2018-07-22 ENCOUNTER — Encounter: Payer: Self-pay | Admitting: Cardiology

## 2018-07-22 ENCOUNTER — Ambulatory Visit: Payer: Federal, State, Local not specified - PPO | Admitting: Cardiology

## 2018-07-22 ENCOUNTER — Encounter (INDEPENDENT_AMBULATORY_CARE_PROVIDER_SITE_OTHER): Payer: Self-pay

## 2018-07-22 VITALS — BP 142/60 | HR 69 | Ht 60.0 in | Wt 143.2 lb

## 2018-07-22 DIAGNOSIS — I10 Essential (primary) hypertension: Secondary | ICD-10-CM

## 2018-07-22 DIAGNOSIS — E785 Hyperlipidemia, unspecified: Secondary | ICD-10-CM

## 2018-07-22 DIAGNOSIS — I4510 Unspecified right bundle-branch block: Secondary | ICD-10-CM | POA: Diagnosis not present

## 2018-07-22 MED ORDER — LOSARTAN POTASSIUM 25 MG PO TABS: 25.0000 mg | ORAL_TABLET | Freq: Every day | ORAL | 3 refills | Status: DC

## 2018-07-22 MED ORDER — ROSUVASTATIN CALCIUM 5 MG PO TABS: 5.0000 mg | ORAL_TABLET | ORAL | 3 refills | Status: DC

## 2018-08-20 ENCOUNTER — Other Ambulatory Visit: Payer: Self-pay | Admitting: *Deleted

## 2018-10-07 IMAGING — RF DG UGI W/ HIGH DENSITY W/KUB
5 series · 14 of 24 positions shown · non-contrast
Comparison: CT of 06/14/2010

CLINICAL DATA: Epigastric abdominal pain. Gastroesophageal reflux
disease.

EXAM:
UPPER GI SERIES WITH KUB
TECHNIQUE: After obtaining a scout radiograph a routine upper GI series was
performed using thin and thick barium
FLUOROSCOPY TIME:  Fluoroscopy Time:  3 minutes and 54 seconds
Number of Acquired Spot Images: 6

[Series 1: one shot · 4 of 7 slices shown (1 of 3)]
[im 1/7]
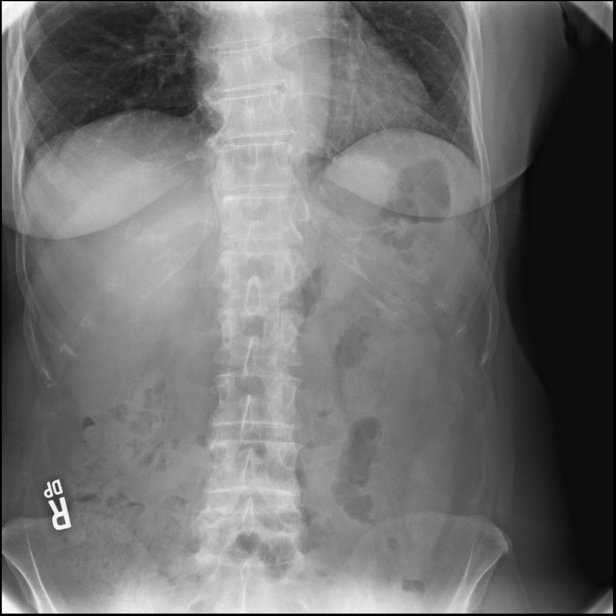
[im 3/7]
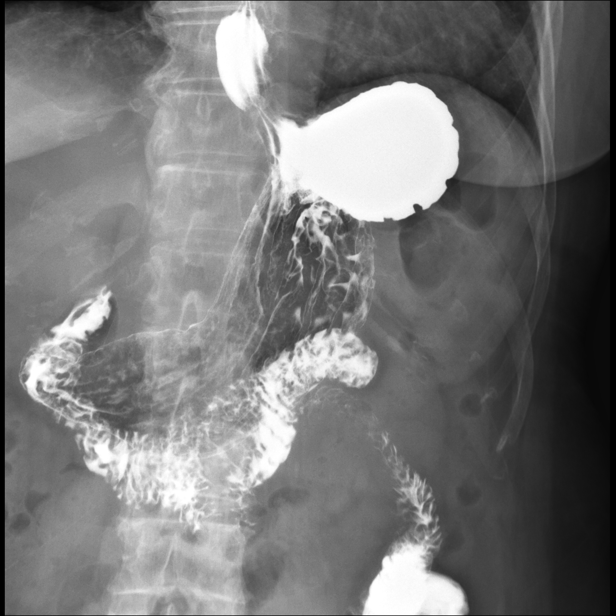
[im 5/7]
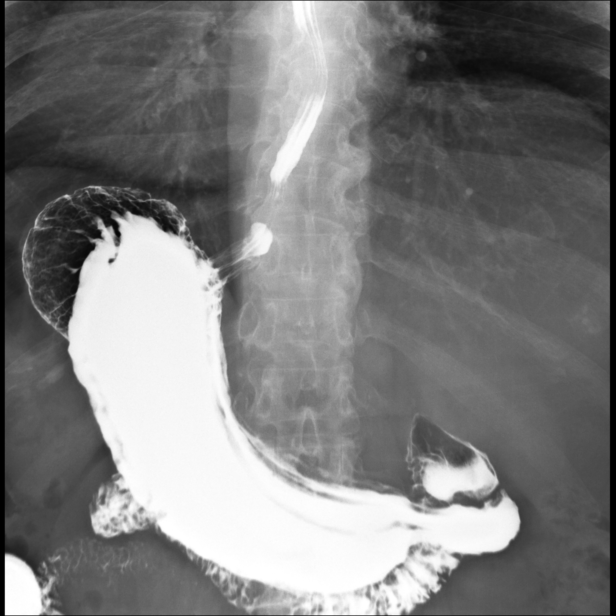
[im 7/7]
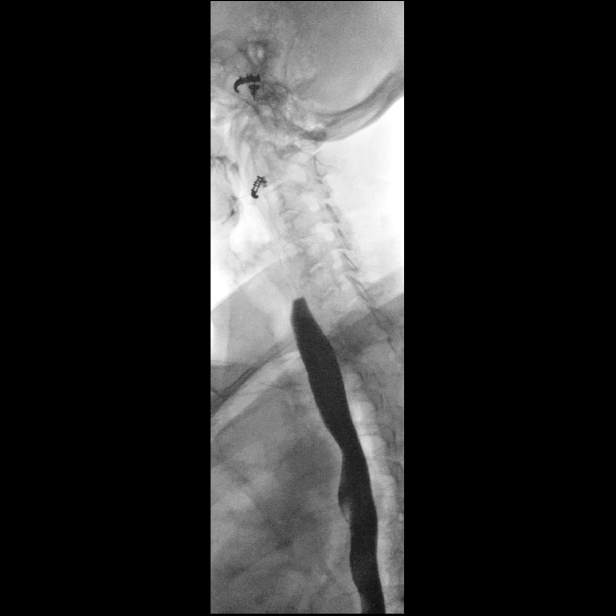

[Series 2: sequence · 2 of 100 frames shown (1 of 2)]
[frame 16/100]
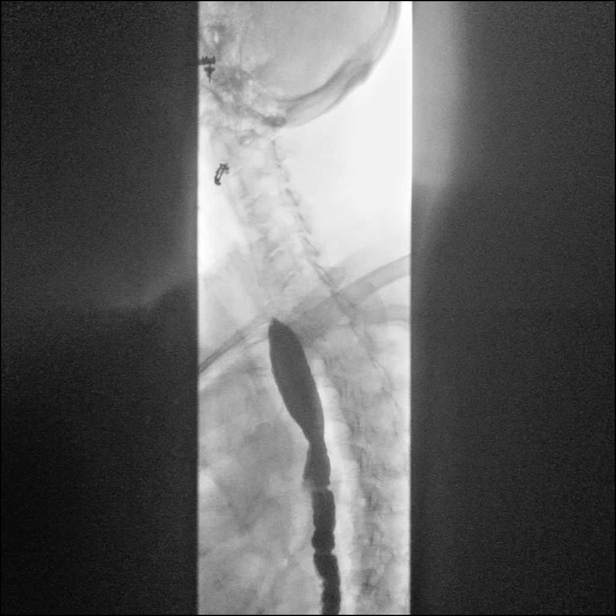
[frame 51/100]
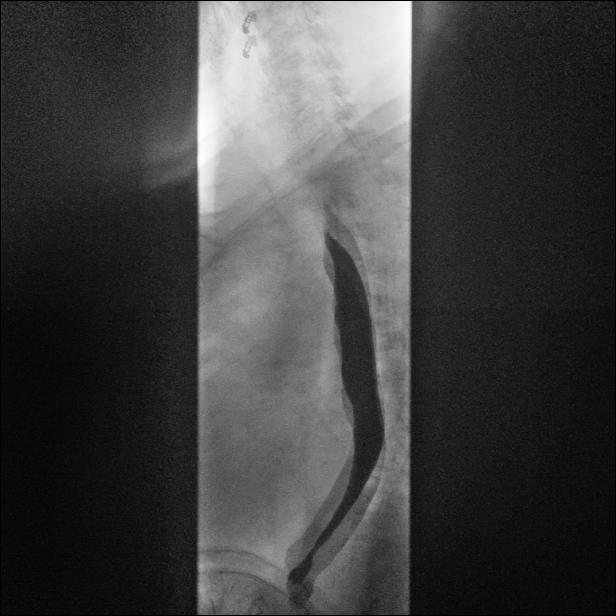

[Series 3: one shot · 1 of 1 slices shown (2 of 3)]
[im 1/1]
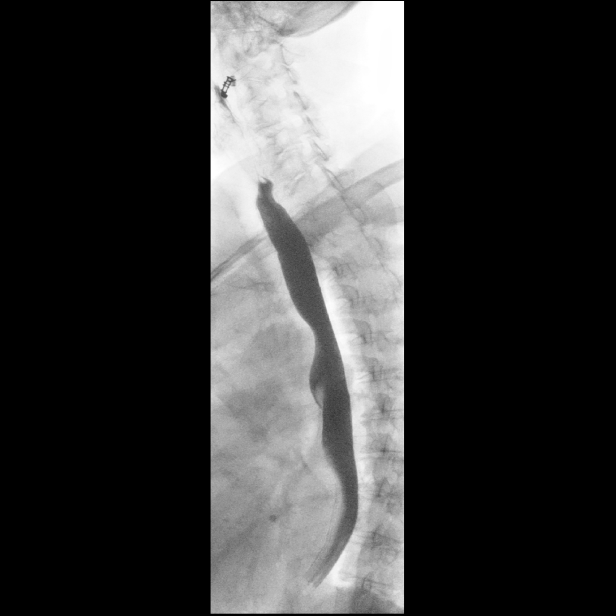

[Series 4: sequence · 2 of 36 frames shown (2 of 2)]
[frame 2/36]
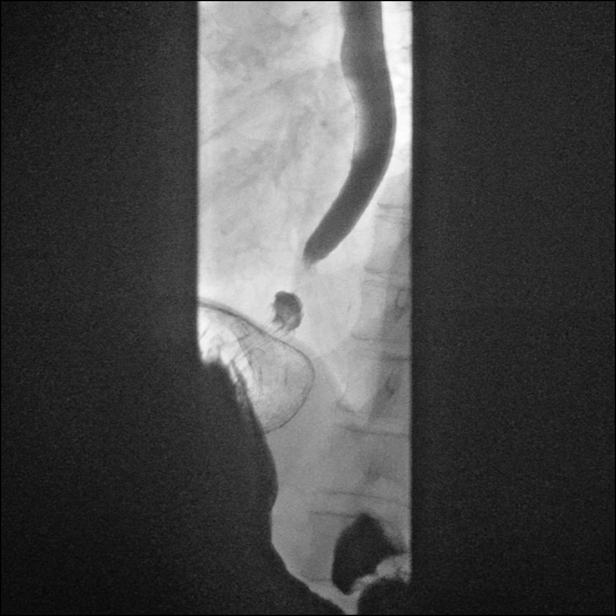
[frame 19/36]
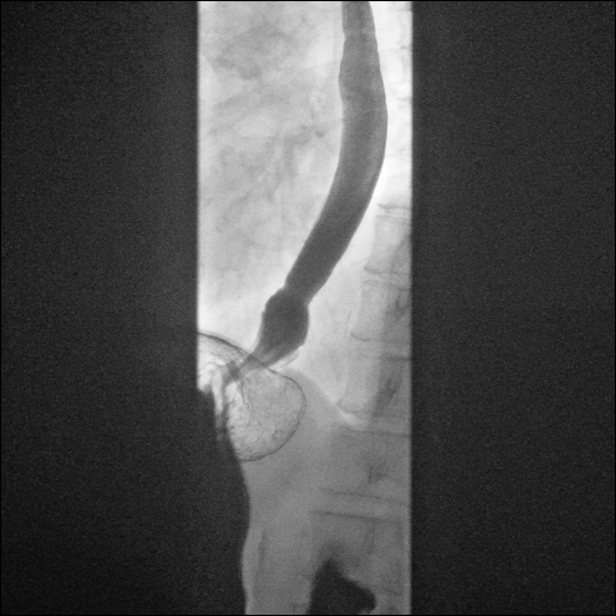

[Series 5: one shot · 5 of 8 slices shown (3 of 3)]
[im 1/8]
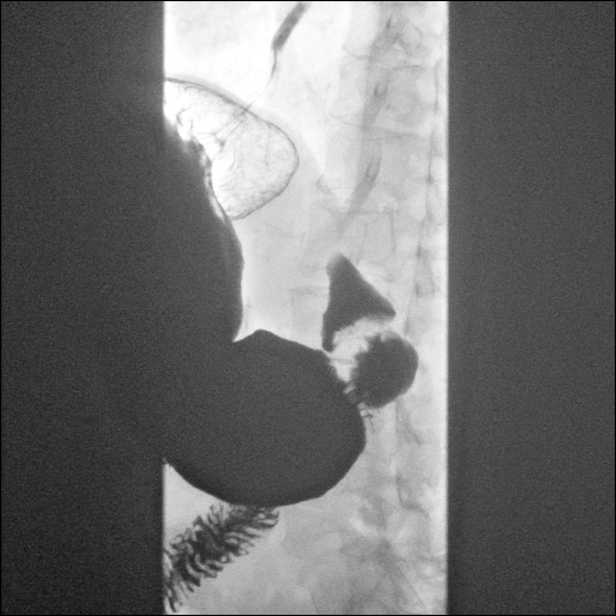
[im 3/8]
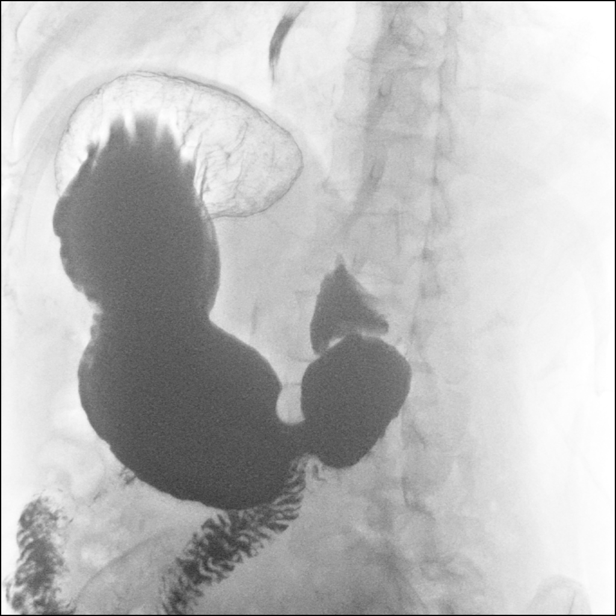
[im 4/8]
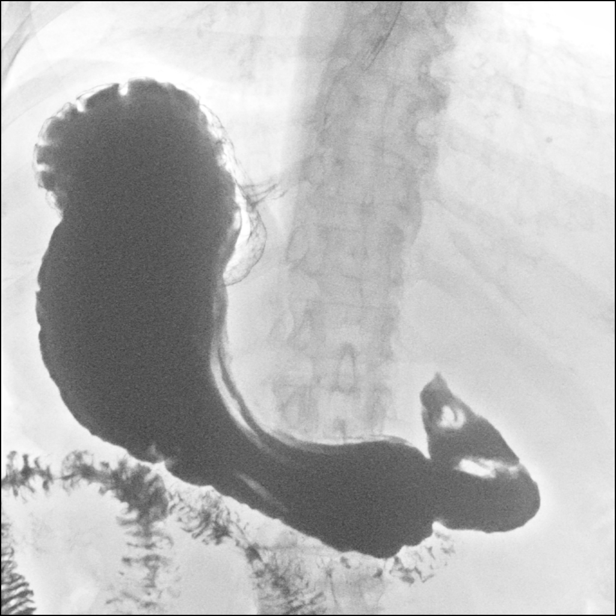
[im 6/8]
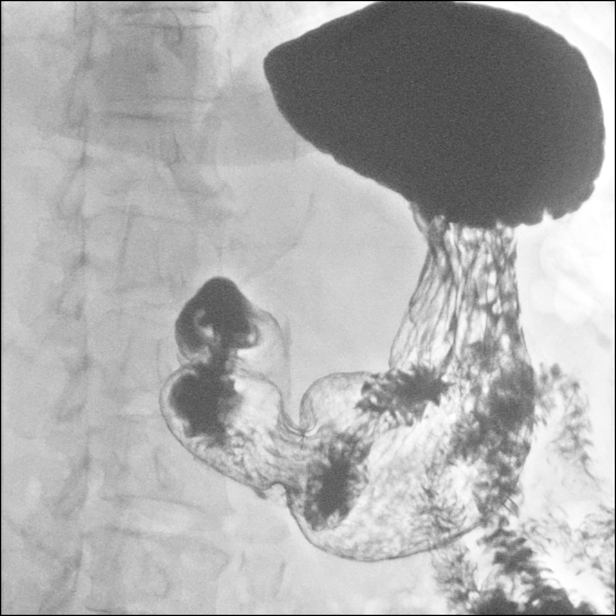
[im 8/8]
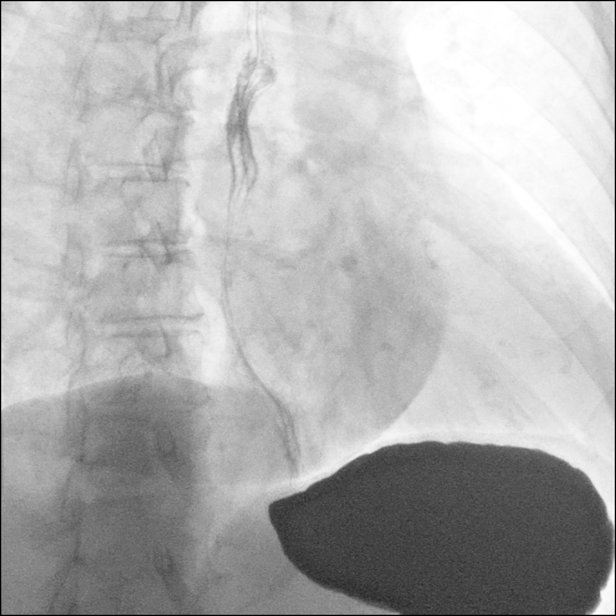

[14 of 24 positions shown; findings below may reference images not displayed]

FINDINGS: Preprocedure radiograph demonstrates a nonobstructive bowel-gas
pattern.

Double contrast evaluation of the stomach demonstrates no mass,
ulcer, or fold abnormality. Normal duodenal bulb and C-loop.

Evaluation of esophageal peristalsis demonstrates proximal escape
and contrast stasis in the mid and upper esophagus.

Small hiatal hernia.

Full column evaluation of the esophagus demonstrates no persistent
narrowing or stricture.
IMPRESSION: 1. Mild esophageal dysmotility, likely early presbyesophagus.
2. Small hiatal hernia.

## 2019-07-02 ENCOUNTER — Telehealth: Payer: Self-pay | Admitting: Cardiology

## 2019-07-02 NOTE — Telephone Encounter (Signed)
Patient was feeling little lightheaded and funny feeling in her chest, no pounding. Feeling little better. She has not been taking her blood pressure, today first reading in quite a while. She is having issues with her eyes and seeing someone next week regarding this. She has not taken her Losartan today and will recheck her blood pressure around lunch.

## 2019-07-02 NOTE — Telephone Encounter (Signed)
Called to follow up on patients blood pressure She has checked her blood pressure several times since this am 152/50 at noon-had been up doing household chores 125/57 132/55 at 1:00 pm She has not had her Losartan today  Will forward to Dr Swaziland for review

## 2019-07-02 NOTE — Telephone Encounter (Signed)
Pt c/o BP issue: STAT if pt c/o blurred vision, one-sided weakness or slurred speech  1. What are your last 5 BP readings?  102/46  2. Are you having any other symptoms (ex. Dizziness, headache, blurred vision, passed out)? lightheaded  3. What is your BP issue? Husband states the pt's BP is usually pretty under control, but when she woke up this morning it was lower than normal. She has not taken her medicine yet today  Husband and Pt just wanted to make Dr. Thomasene Lot aware

## 2019-07-02 NOTE — Telephone Encounter (Signed)
Spoke to patient Dr.Jordan's advice given.Stated she is seeing a neuro eye specialist next week.Advised to keep appointment as planned with Dr.Jordan.

## 2019-07-02 NOTE — Telephone Encounter (Signed)
BP looks OK. No reason she should stop losartan  Brittany Codispoti Sw[place] MD, [facility]

## 2019-07-31 NOTE — Progress Notes (Signed)
Cardiology Office Note    Date:  08/04/2019   ID:  Brittany Sawyer, DOB [birth year], MRN [id number]  PCP:  [name], MD  Cardiologist:  Dr. [place]  Chief Complaint  Patient presents with  . Hypertension  . Hyperlipidemia    History of Present Illness:  Brittany Sawyer is a 79 y.o. female with PMH of hypothyroidism s/p radioactive iodine thyroid ablation, GERD and hyperlipidemia. She was last seen  on 05/03/2015 for evaluation of chest pain. She has no history of hypertension and diabetes. She does have family history of CAD. She has been intolerant to Lipitor due to joint aches. Given the exertional nature of her chest discomfort, a stress Myoview was obtained on 03/03/2015 which showed EF 66%, 2 mm ST depression noted in II, III, aVF, V5 and V6 leads that began at 4 minutes into the stress, otherwise normal perfusion, overall consider low risk study however cannot rule out balanced ischemia given positive Ecg changes. She also had hypertensive BP response during the stress test, she was placed on trial of medical therapy with metoprolol 25 mg twice a day and aspirin daily. Unfortunately she could not tolerate metoprolol as it causes her to have hot flushes. She was later placed on lisinopril. Crestor 5 mg every other day was recommended given her prior intolerance to Lipitor.  She was seen on 03/23/2016.  She developed a cough and nasal congestion on lisinopril and this was stopped. Imdur was added for chest pain. It was recommended  to consider starting Crestor 5 mg every other day. Echocardiogram obtained on 04/09/2016 showed EF 65-70%, grade 1 diastolic dysfunction, mild TR. She was later started on losartan 25 mg daily.  When last seen in March 2019 she had some intermittent chest pain. A coronary CT was ordered. There appears to be some confusion about her being pretreated for contrast allergy and the result is that this was never done. She states she had lost a lot of weight at that  time and since she gained weight she has no further chest pain. It was listed in our record that she had hives related to contrast dye. She denies ever having this and has since had a CT head with contrast without any reaction.  On follow up today she is having eye problems with wavering vision. This affects her balance. Seeing Neuro-ophthalmology and Neurology.  Denies any chest pain or dyspnea now.    Past Medical History:  Diagnosis Date  . GERD (gastroesophageal reflux disease)   . Hyperlipidemia   . Hypothyroid   . S/P radioactive iodine thyroid ablation     Past Surgical History:  Procedure Laterality Date  . right ankle fracture surgery      Current Medications: Outpatient Medications Prior to Visit  Medication Sig Dispense Refill  . ALPHAGAN P 0.1 % SOLN Place 1 drop into both eyes 3 (three) times daily.    Marland Kitchen aspirin EC 81 MG tablet Take 1 tablet (81 mg total) by mouth daily. 90 tablet 3  . diphenhydrAMINE (BENADRYL) 50 MG capsule Take 50 mg along with last dose of prednisone before leaving home for coronary ct 1 capsule 0  . levothyroxine (SYNTHROID, LEVOTHROID) 125 MCG tablet Take 125 mcg by mouth daily before breakfast.    . predniSONE (DELTASONE) 50 MG tablet Take 50 mg 13 hours before coronary ct and 50 mg 7 hours before and 50 mg with a 50 mg Benadryl before leaving home for coronary ct 3 tablet 0  .  PROTONIX 40 MG tablet Take 1 tablet by mouth daily.    Marland Kitchen ZIOPTAN 0.0015 % SOLN One drop each eye at bed time    . losartan (COZAAR) 25 MG tablet Take 1 tablet (25 mg total) by mouth daily. 90 tablet 3  . rosuvastatin (CRESTOR) 5 MG tablet Take 1 tablet (5 mg total) by mouth every other day. 90 tablet 3   No facility-administered medications prior to visit.     Allergies:   2,4-d dimethylamine (amisol); Metoprolol; Rosuvastatin; Amoxicillin; Aspirin; and Sulfa antibiotics   Social History   Socioeconomic History  . Marital status: Married    Spouse name: Not on file   . Number of children: 2  . Years of education: Not on file  . Highest education level: Not on file  Occupational History  . Not on file  Tobacco Use  . Smoking status: Former Smoker    Packs/day: 0.50    Years: 30.00    Pack years: 15.00    Types: Cigarettes  . Smokeless tobacco: Never Used  Substance and Sexual Activity  . Alcohol use: No    Alcohol/week: 0.0 standard drinks  . Drug use: No  . Sexual activity: Not on file  Other Topics Concern  . Not on file  Social History Narrative  . Not on file   Social Determinants of Health   Financial Resource Strain:   . Difficulty of Paying Living Expenses: Not on file  Food Insecurity:   . Worried About Programme researcher, broadcasting/film/video in the Last Year: Not on file  . Ran Out of Food in the Last Year: Not on file  Transportation Needs:   . Lack of Transportation (Medical): Not on file  . Lack of Transportation (Non-Medical): Not on file  Physical Activity:   . Days of Exercise per Week: Not on file  . Minutes of Exercise per Session: Not on file  Stress:   . Feeling of Stress : Not on file  Social Connections:   . Frequency of Communication with Friends and Family: Not on file  . Frequency of Social Gatherings with Friends and Family: Not on file  . Attends Religious Services: Not on file  . Active Member of Clubs or Organizations: Not on file  . Attends Banker Meetings: Not on file  . Marital Status: Not on file     Family History:  The patient's family history includes Cancer in her mother; Heart attack in her father; Heart disease in her brother and sister; Other in her daughter, sister, and son.   ROS:   Please see the history of present illness.    ROS All other systems reviewed and are negative.   PHYSICAL EXAM:   VS:  BP 140/60   Pulse 62   Temp (!) 97.3 F (36.3 C)   Ht 5' (1.524 m)   Wt 141 lb (64 kg)   SpO2 98%   BMI 27.54 kg/m    GENERAL:  Well appearing WF in NAD HEENT:  PERRL, EOMI, sclera are  clear. Oropharynx is clear. NECK:  No jugular venous distention, carotid upstroke brisk and symmetric, no bruits, no thyromegaly or adenopathy LUNGS:  Clear to auscultation bilaterally CHEST:  Unremarkable HEART:  RRR,  PMI not displaced or sustained,S1 and S2 within normal limits, no S3, no S4: no clicks, no rubs, no murmurs ABD:  Soft, nontender. BS +, no masses or bruits. No hepatomegaly, no splenomegaly EXT:  2 + pulses throughout, no edema, no  cyanosis no clubbing SKIN:  Warm and dry.  No rashes NEURO:  Alert and oriented x 3. Cranial nerves II through XII intact. PSYCH:  Cognitively intact    Wt Readings from Last 3 Encounters:  08/04/19 141 lb (64 kg)  07/22/18 143 lb 3.2 oz (65 kg)  08/14/17 132 lb 3.2 oz (60 kg)      Studies/Labs Reviewed:   EKG:  EKG is ordered today. NSR with RBBB. Rate 62. occ PAC. Otherwise normal. I have personally reviewed and interpreted this study.   Recent Labs: No results found for requested labs within last 8760 hours.   Lipid Panel No results found for: CHOL, TRIG, HDL, CHOLHDL, VLDL, LDLCALC, LDLDIRECT  Additional studies/ records that were reviewed today include:   Labs dated 02/16/16: Cholesterol 202, triglycerides 109, HDL 55, LDL 125, glucose 128. Other chemistries and CBC normal. Last TSH normal. Dated 08/26/17: CMET, CBC normal. A1c 6.2%. cholesterol 124, triglycerides 87, HDL 50, LDL 57.  Dated 01/05/19: Normal CBC and TSH.  Dated 02/24/19: Normal CMET except glucose 128.   Myoview 03/03/2015 Study Highlights    The left ventricular ejection fraction is normal (55-65%).  Nuclear stress EF: 66%.  ST segment depression of 2 mm was noted during stress in the II, aVF, III, V5 and V6 leads, beginning at 4 minutes of stress.  T wave inversion was noted during stress.  The study is normal.  This is a low risk study.  1. Nl Perfusion and EF 2.Positive GXT 3. Low risk study. Can not R/O balanced ischemia      Echo  04/09/2016 LV EF: 65% -   70%  - Left ventricle: The cavity size was normal. Systolic function was   vigorous. The estimated ejection fraction was in the range of 65%   to 70%. Wall motion was normal; there were no regional wall   motion abnormalities. Doppler parameters are consistent with   abnormal left ventricular relaxation (grade 1 diastolic   dysfunction). Doppler parameters are consistent with elevated   ventricular end-diastolic filling pressure. - Aortic valve: Trileaflet; normal thickness leaflets. There was no   regurgitation. - Aortic root: The aortic root was normal in size. - Ascending aorta: The ascending aorta was normal in size. - Mitral valve: Calcified annulus. Mildly thickened leaflets .   There was trivial regurgitation. - Left atrium: The atrium was normal in size. - Right ventricle: Systolic function was normal. - Tricuspid valve: There was mild regurgitation. - Pulmonic valve: There was no regurgitation. - Pulmonary arteries: Systolic pressure was within the normal   range. - Inferior vena cava: The vessel was normal in size. - Pericardium, extracardiac: There was no pericardial effusion.   ASSESSMENT:    1. Essential hypertension   2. RBBB   3. Hyperlipidemia, unspecified hyperlipidemia type   4. Precordial pain      PLAN:  In order of problems listed above:  1. HTN well controlled. Refilled meds.   2. HLD: on low dose Crestor. Controlled.   3. Hypothyroidism: on Synthroid  4.   History of atypical chest pain- resolved. Continue to monitor.     Medication Adjustments/Labs and Tests Ordered: Current medicines are reviewed at length with the patient today.  Concerns regarding medicines are outlined above.  Medication changes, Labs and Tests ordered today are listed in the Patient Instructions below. There are no Patient Instructions on file for this visit.   Signed, Semya Klinke [place], MD  08/04/2019 9:44 AM    [facility] [address] [place], [place], [place]  [address] Phone: [phone]; Fax: [phone]

## 2019-08-04 ENCOUNTER — Ambulatory Visit: Payer: Federal, State, Local not specified - PPO | Admitting: Cardiology

## 2019-08-04 ENCOUNTER — Encounter: Payer: Self-pay | Admitting: Cardiology

## 2019-08-04 ENCOUNTER — Other Ambulatory Visit: Payer: Self-pay

## 2019-08-04 VITALS — BP 140/60 | HR 62 | Temp 97.3°F | Ht 60.0 in | Wt 141.0 lb

## 2019-08-04 DIAGNOSIS — R072 Precordial pain: Secondary | ICD-10-CM

## 2019-08-04 DIAGNOSIS — I10 Essential (primary) hypertension: Secondary | ICD-10-CM

## 2019-08-04 DIAGNOSIS — I4510 Unspecified right bundle-branch block: Secondary | ICD-10-CM

## 2019-08-04 DIAGNOSIS — E785 Hyperlipidemia, unspecified: Secondary | ICD-10-CM

## 2019-08-04 MED ORDER — ROSUVASTATIN CALCIUM 5 MG PO TABS: 5.0000 mg | ORAL_TABLET | ORAL | 3 refills | Status: AC

## 2019-08-04 MED ORDER — LOSARTAN POTASSIUM 25 MG PO TABS: 25.0000 mg | ORAL_TABLET | Freq: Every day | ORAL | 3 refills | Status: DC

## 2019-08-06 ENCOUNTER — Telehealth: Payer: Self-pay

## 2019-08-06 ENCOUNTER — Telehealth: Payer: Self-pay | Admitting: Cardiology

## 2019-08-06 NOTE — Telephone Encounter (Signed)
New message  [facility] states that there is an allergy alert on this medication rosuvastatin (CRESTOR) 5 MG tablet. Need to know if okay to fill this prescription for the patient.

## 2019-08-06 NOTE — Telephone Encounter (Signed)
Pt/husband reported the pt is not allergic to rosuvastatin... may have been listed as for sensitivity she has had in the past for muscle aches but the pt has been on it and has been tolerating it well... pts husband says they have enough refills at the local RX not to have [facility] fill at this time.   Darreld Mclean with [facility] advised.

## 2019-08-06 NOTE — Telephone Encounter (Signed)
Unable to get through on pt home number... called multiple times and says call unable to be completed. LMTCB on cell number listed.

## 2019-08-06 NOTE — Telephone Encounter (Signed)
Called patient left message on personal voice mail to call me back about a recent fax from your mail order pharmacy regarding crestor prescription.

## 2019-08-06 NOTE — Telephone Encounter (Signed)
No message needed °

## 2020-08-01 MED ORDER — LOSARTAN POTASSIUM 25 MG PO TABS: 25.0000 mg | ORAL_TABLET | Freq: Every day | ORAL | 0 refills | Status: DC

## 2020-09-05 NOTE — Progress Notes (Signed)
Cardiology Office Note    Date:  09/08/2020   ID:  Brittany Sawyer, DOB 03/16/1941, MRN [id number]  PCP:  [name], MD  Cardiologist:  Dr. [place]  Chief Complaint  Patient presents with  . Hypertension    History of Present Illness:  Brittany Sawyer is a 80 y.o. female with PMH of hypothyroidism s/p radioactive iodine thyroid ablation, GERD and hyperlipidemia. She was last seen  on 05/03/2015 for evaluation of chest pain. She has no history of hypertension and diabetes. She does have family history of CAD. She has been intolerant to Lipitor due to joint aches. Given the exertional nature of her chest discomfort, a stress Myoview was obtained on 03/03/2015 which showed EF 66%, 2 mm ST depression noted in II, III, aVF, V5 and V6 leads that began at 4 minutes into the stress, otherwise normal perfusion, overall consider low risk study however cannot rule out balanced ischemia given positive Ecg changes. She also had hypertensive BP response during the stress test, she was placed on trial of medical therapy with metoprolol 25 mg twice a day and aspirin daily. Unfortunately she could not tolerate metoprolol as it causes her to have hot flushes. She was later placed on lisinopril. Crestor 5 mg every other day was recommended given her prior intolerance to Lipitor.  She was seen on 03/23/2016.  She developed a cough and nasal congestion on lisinopril and this was stopped. Imdur was added for chest pain. It was recommended  to consider starting Crestor 5 mg every other day. Echocardiogram obtained on 04/09/2016 showed EF 65-70%, grade 1 diastolic dysfunction, mild TR. She was later started on losartan 25 mg daily.  When seen in March 2019 she had some intermittent chest pain. A coronary CT was ordered. There appears to be some confusion about her being pretreated for contrast allergy and the result is that this was never done. She states she had lost a lot of weight at that time and since she  gained weight she has no further chest pain. It was listed in our record that she had hives related to contrast dye. She denies ever having this and has since had a CT head with contrast without any reaction.  On follow up today she is doing very well. BP is well controlled. She denies any chest pain or dyspnea. Still has difficulty with her vision/movement. Neuro ophthalmology evaluation was unremarkable.    Past Medical History:  Diagnosis Date  . GERD (gastroesophageal reflux disease)   . Hyperlipidemia   . Hypothyroid   . S/P radioactive iodine thyroid ablation     Past Surgical History:  Procedure Laterality Date  . right ankle fracture surgery      Current Medications: Outpatient Medications Prior to Visit  Medication Sig Dispense Refill  . ALPHAGAN P 0.1 % SOLN Place 1 drop into both eyes 3 (three) times daily.    Marland Kitchen esomeprazole (NEXIUM) 20 MG capsule Take by mouth.    . levothyroxine (SYNTHROID, LEVOTHROID) 125 MCG tablet Take 125 mcg by mouth daily before breakfast.    . loratadine (CLARITIN) 10 MG tablet Take 10 mg by mouth daily.    . rosuvastatin (CRESTOR) 5 MG tablet Take 1 tablet (5 mg total) by mouth every other day. 90 tablet 3  . ZIOPTAN 0.0015 % SOLN One drop each eye at bed time    . losartan (COZAAR) 25 MG tablet Take 1 tablet (25 mg total) by mouth daily. 90 tablet 0  .  aspirin EC 81 MG tablet Take 1 tablet (81 mg total) by mouth daily. 90 tablet 3  . diphenhydrAMINE (BENADRYL) 50 MG capsule Take 50 mg along with last dose of prednisone before leaving home for coronary ct 1 capsule 0  . predniSONE (DELTASONE) 50 MG tablet Take 50 mg 13 hours before coronary ct and 50 mg 7 hours before and 50 mg with a 50 mg Benadryl before leaving home for coronary ct 3 tablet 0  . PROTONIX 40 MG tablet Take 1 tablet by mouth daily.     No facility-administered medications prior to visit.     Allergies:   2,4-d dimethylamine (amisol); Metoprolol; Rosuvastatin; Amoxicillin;  Aspirin; and Sulfa antibiotics   Social History   Socioeconomic History  . Marital status: Married    Spouse name: Not on file  . Number of children: 2  . Years of education: Not on file  . Highest education level: Not on file  Occupational History  . Not on file  Tobacco Use  . Smoking status: Former Smoker    Packs/day: 0.50    Years: 30.00    Pack years: 15.00    Types: Cigarettes  . Smokeless tobacco: Never Used  Substance and Sexual Activity  . Alcohol use: No    Alcohol/week: 0.0 standard drinks  . Drug use: No  . Sexual activity: Not on file  Other Topics Concern  . Not on file  Social History Narrative  . Not on file   Social Determinants of Health   Financial Resource Strain: Not on file  Food Insecurity: Not on file  Transportation Needs: Not on file  Physical Activity: Not on file  Stress: Not on file  Social Connections: Not on file     Family History:  The patient's family history includes Cancer in her mother; Heart attack in her father; Heart disease in her brother and sister; Other in her daughter, sister, and son.   ROS:   Please see the history of present illness.    ROS All other systems reviewed and are negative.   PHYSICAL EXAM:   VS:  BP 128/64 (BP Location: Left Arm, Patient Position: Sitting, Cuff Size: Normal)   Pulse (!) 57   Ht 4' 11.5" (1.511 m)   Wt 139 lb 9.6 oz (63.3 kg)   SpO2 98%   BMI 27.72 kg/m    GENERAL:  Well appearing WF in NAD HEENT:  PERRL, EOMI, sclera are clear. Oropharynx is clear. NECK:  No jugular venous distention, carotid upstroke brisk and symmetric, no bruits, no thyromegaly or adenopathy LUNGS:  Clear to auscultation bilaterally CHEST:  Unremarkable HEART:  RRR,  PMI not displaced or sustained,S1 and S2 within normal limits, no S3, no S4: no clicks, no rubs, no murmurs ABD:  Soft, nontender. BS +, no masses or bruits. No hepatomegaly, no splenomegaly EXT:  2 + pulses throughout, no edema, no cyanosis no  clubbing SKIN:  Warm and dry.  No rashes NEURO:  Alert and oriented x 3. Cranial nerves II through XII intact. PSYCH:  Cognitively intact    Wt Readings from Last 3 Encounters:  09/08/20 139 lb 9.6 oz (63.3 kg)  08/04/19 141 lb (64 kg)  07/22/18 143 lb 3.2 oz (65 kg)      Studies/Labs Reviewed:   EKG:  EKG is ordered today. NSR with first degree AV block and RBBB.  Rate 57. Some T wave inversion inferiorly.   have personally reviewed and interpreted this study.  Recent Labs: No results found for requested labs within last 8760 hours.   Lipid Panel No results found for: CHOL, TRIG, HDL, CHOLHDL, VLDL, LDLCALC, LDLDIRECT  Additional studies/ records that were reviewed today include:   Labs dated 02/16/16: Cholesterol 202, triglycerides 109, HDL 55, LDL 125, glucose 128. Other chemistries and CBC normal. Last TSH normal. Dated 08/26/17: CMET, CBC normal. A1c 6.2%. cholesterol 124, triglycerides 87, HDL 50, LDL 57.  Dated 01/05/19: Normal CBC and TSH.  Dated 02/24/19: Normal CMET except glucose 128.  Dated 12/24/19: CMET, CBC, TSH normal. Cholesterol 145, triglycerides 101, HDL 44, LDL 82.   Myoview 03/03/2015 Study Highlights    The left ventricular ejection fraction is normal (55-65%).  Nuclear stress EF: 66%.  ST segment depression of 2 mm was noted during stress in the II, aVF, III, V5 and V6 leads, beginning at 4 minutes of stress.  T wave inversion was noted during stress.  The study is normal.  This is a low risk study.  1. Nl Perfusion and EF 2.Positive GXT 3. Low risk study. Can not R/O balanced ischemia      Echo 04/09/2016 LV EF: 65% -   70%  - Left ventricle: The cavity size was normal. Systolic function was   vigorous. The estimated ejection fraction was in the range of 65%   to 70%. Wall motion was normal; there were no regional wall   motion abnormalities. Doppler parameters are consistent with   abnormal left ventricular relaxation (grade 1  diastolic   dysfunction). Doppler parameters are consistent with elevated   ventricular end-diastolic filling pressure. - Aortic valve: Trileaflet; normal thickness leaflets. There was no   regurgitation. - Aortic root: The aortic root was normal in size. - Ascending aorta: The ascending aorta was normal in size. - Mitral valve: Calcified annulus. Mildly thickened leaflets .   There was trivial regurgitation. - Left atrium: The atrium was normal in size. - Right ventricle: Systolic function was normal. - Tricuspid valve: There was mild regurgitation. - Pulmonic valve: There was no regurgitation. - Pulmonary arteries: Systolic pressure was within the normal   range. - Inferior vena cava: The vessel was normal in size. - Pericardium, extracardiac: There was no pericardial effusion.   ASSESSMENT:    1. Essential hypertension   2. Hyperlipidemia, unspecified hyperlipidemia type      PLAN:  In order of problems listed above:  1. HTN well controlled. Refilled meds.   2. HLD: on low dose Crestor. Controlled.   3. Hypothyroidism: on Synthroid  4.   History of atypical chest pain- resolved.     Medication Adjustments/Labs and Tests Ordered: Current medicines are reviewed at length with the patient today.  Concerns regarding medicines are outlined above.  Medication changes, Labs and Tests ordered today are listed in the Patient Instructions below. There are no Patient Instructions on file for this visit.   Signed, Riona Lahti [place], MD  09/08/2020 5:17 PM    [facility] [address] [place], [place], [place]  [address] Phone: [phone]; Fax: [phone]

## 2020-09-08 ENCOUNTER — Other Ambulatory Visit: Payer: Self-pay

## 2020-09-08 ENCOUNTER — Ambulatory Visit: Payer: Federal, State, Local not specified - PPO | Admitting: Cardiology

## 2020-09-08 ENCOUNTER — Encounter: Payer: Self-pay | Admitting: Cardiology

## 2020-09-08 VITALS — BP 128/64 | HR 57 | Ht 59.5 in | Wt 139.6 lb

## 2020-09-08 DIAGNOSIS — I10 Essential (primary) hypertension: Secondary | ICD-10-CM | POA: Diagnosis not present

## 2020-09-08 DIAGNOSIS — E785 Hyperlipidemia, unspecified: Secondary | ICD-10-CM | POA: Diagnosis not present

## 2020-09-08 MED ORDER — LOSARTAN POTASSIUM 25 MG PO TABS
25.0000 mg | ORAL_TABLET | Freq: Every day | ORAL | 3 refills | Status: DC
Start: 2020-09-08 — End: 2021-01-30

## 2021-01-06 ENCOUNTER — Telehealth: Payer: Self-pay | Admitting: Cardiology

## 2021-01-06 NOTE — Telephone Encounter (Signed)
Pt called stating this afternoon she has been experiencing on and off dizziness when she move her head or bend down. Pt denies any other symptoms but report BP 125/52 HR 65. Pt state she did give blood yesterday and just finished a round of prednisone. Pt state right now she feels fine but just concerned because her blood pressure has never been that low.   Nurse advised pt that BP is WNL but will forward to MD for further recommendations. Pt also made aware of ED precaution should any new symptoms develop or worsen.

## 2021-01-06 NOTE — Telephone Encounter (Signed)
Pt c/o BP issue: STAT if pt c/o blurred vision, one-sided weakness or slurred speech  1. What are your last 5 BP readings? 147/60; 125/52  2. Are you having any other symptoms (ex. Dizziness, headache, blurred vision, passed out)? Sluggish, dizzy, blurry vision  3. What is your BP issue? Low blood pressure; gave blood yesterday; last day of taking predisone

## 2021-01-09 NOTE — Telephone Encounter (Signed)
Called patient left message on personal voice mail to call me back. 

## 2021-01-11 NOTE — Telephone Encounter (Signed)
Spoke to patient Dr.Jordan's advice given. 

## 2021-01-11 NOTE — Telephone Encounter (Signed)
Vitals look OK. ? If symptoms related to steroids. Let us know if symptoms persist.  Nichelle Renwick [place] MD, [facility]

## 2021-01-30 ENCOUNTER — Telehealth: Payer: Self-pay | Admitting: Cardiology

## 2021-01-30 ENCOUNTER — Other Ambulatory Visit: Payer: Self-pay | Admitting: Cardiology

## 2021-01-30 NOTE — Telephone Encounter (Signed)
*  STAT* If patient is at the pharmacy, call can be transferred to refill team.   1. Which medications need to be refilled? (please list name of each medication and dose if known) losartan (COZAAR) 25 MG tablet  2. Which pharmacy/location (including street and city if local pharmacy) is medication to be sent to? [facility]  3. Do they need a 30 day or 90 day supply? 90 day

## 2021-01-31 ENCOUNTER — Other Ambulatory Visit: Payer: Self-pay

## 2021-01-31 MED ORDER — LOSARTAN POTASSIUM 25 MG PO TABS: 25.0000 mg | ORAL_TABLET | Freq: Every day | ORAL | 2 refills | Status: DC

## 2021-01-31 NOTE — Telephone Encounter (Signed)
90-day Refill for losartan sent to [facility] today

## 2021-03-16 DIAGNOSIS — R519 Headache, unspecified: Secondary | ICD-10-CM | POA: Diagnosis not present

## 2021-03-16 DIAGNOSIS — J329 Chronic sinusitis, unspecified: Secondary | ICD-10-CM | POA: Diagnosis not present

## 2021-03-21 DIAGNOSIS — H401132 Primary open-angle glaucoma, bilateral, moderate stage: Secondary | ICD-10-CM | POA: Diagnosis not present

## 2021-03-21 DIAGNOSIS — H18593 Other hereditary corneal dystrophies, bilateral: Secondary | ICD-10-CM | POA: Diagnosis not present

## 2021-03-21 DIAGNOSIS — H02051 Trichiasis without entropian right upper eyelid: Secondary | ICD-10-CM | POA: Diagnosis not present

## 2021-04-07 DIAGNOSIS — E7801 Familial hypercholesterolemia: Secondary | ICD-10-CM | POA: Diagnosis not present

## 2021-04-07 DIAGNOSIS — Z130 Encounter for screening for diseases of the blood and blood-forming organs and certain disorders involving the immune mechanism: Secondary | ICD-10-CM | POA: Diagnosis not present

## 2021-04-07 DIAGNOSIS — E890 Postprocedural hypothyroidism: Secondary | ICD-10-CM | POA: Diagnosis not present

## 2021-04-07 DIAGNOSIS — R739 Hyperglycemia, unspecified: Secondary | ICD-10-CM | POA: Diagnosis not present

## 2021-04-07 DIAGNOSIS — Z0000 Encounter for general adult medical examination without abnormal findings: Secondary | ICD-10-CM | POA: Diagnosis not present

## 2021-05-18 DIAGNOSIS — H401132 Primary open-angle glaucoma, bilateral, moderate stage: Secondary | ICD-10-CM | POA: Diagnosis not present

## 2021-05-24 DIAGNOSIS — G629 Polyneuropathy, unspecified: Secondary | ICD-10-CM | POA: Diagnosis not present

## 2021-05-24 DIAGNOSIS — M546 Pain in thoracic spine: Secondary | ICD-10-CM | POA: Diagnosis not present

## 2021-05-24 DIAGNOSIS — J301 Allergic rhinitis due to pollen: Secondary | ICD-10-CM | POA: Diagnosis not present

## 2021-05-26 DIAGNOSIS — M546 Pain in thoracic spine: Secondary | ICD-10-CM | POA: Diagnosis not present

## 2021-08-09 DIAGNOSIS — M26621 Arthralgia of right temporomandibular joint: Secondary | ICD-10-CM | POA: Diagnosis not present

## 2021-08-09 DIAGNOSIS — H9313 Tinnitus, bilateral: Secondary | ICD-10-CM | POA: Diagnosis not present

## 2021-08-31 NOTE — Progress Notes (Signed)
? ?Cardiology Office Note   ? ?Date:  09/05/2021  ? ?ID:  Brittany Sawyer, DOB [birth year], MRN [id number] ? ?PCP:  Chesley Noon, MD  ?Cardiologist:  Dr. Martinique ? ?Chief Complaint  ?Patient presents with  ? Hypertension  ? Hyperlipidemia  ? ? ?History of Present Illness:  ?Brittany Sawyer is a 81 y.o. female with PMH of hypothyroidism s/p radioactive iodine thyroid ablation, GERD and hyperlipidemia. She was last seen  on 05/03/2015 for evaluation of chest pain. She has no history of hypertension and diabetes. She does have family history of CAD. She has been intolerant to Lipitor due to joint aches. Given the exertional nature of her chest discomfort, a stress Myoview was obtained on 03/03/2015 which showed EF 66%, 2 mm ST depression noted in II, III, aVF, V5 and V6 leads that began at 4 minutes into the stress, otherwise normal perfusion, overall consider low risk study however cannot rule out balanced ischemia given positive Ecg changes. She also had hypertensive BP response during the stress test, she was placed on trial of medical therapy with metoprolol 25 mg twice a day and aspirin daily. Unfortunately she could not tolerate metoprolol as it causes her to have hot flushes. She was later placed on lisinopril. Crestor 5 mg every other day was recommended given her prior intolerance to Lipitor. ? ?She was seen on 03/23/2016.  She developed a cough and nasal congestion on lisinopril and this was stopped. Imdur was added for chest pain. It was recommended  to consider starting Crestor 5 mg every other day. Echocardiogram obtained on 04/09/2016 showed EF [id number], grade 1 diastolic dysfunction, mild TR. She was later started on losartan 25 mg daily. ? ?On follow up today she is doing very well. BP is well controlled. She denies any chest pain or dyspnea. Still has difficulty with her vision/movement. Neuro ophthalmology has evaluated and notes there is no cure.  ? ? ?Past Medical History:  ?Diagnosis Date  ? GERD  (gastroesophageal reflux disease)   ? Hyperlipidemia   ? Hypothyroid   ? S/P radioactive iodine thyroid ablation   ? ? ?Past Surgical History:  ?Procedure Laterality Date  ? right ankle fracture surgery    ? ? ?Current Medications: ?Outpatient Medications Prior to Visit  ?Medication Sig Dispense Refill  ? ALPHAGAN P 0.1 % SOLN Place 1 drop into both eyes 3 (three) times daily.    ? esomeprazole (NEXIUM) 20 MG capsule Take by mouth.    ? levothyroxine (SYNTHROID, LEVOTHROID) 125 MCG tablet Take 125 mcg by mouth daily before breakfast.    ? loratadine (CLARITIN) 10 MG tablet Take 10 mg by mouth daily.    ? losartan (COZAAR) 25 MG tablet Take 1 tablet (25 mg total) by mouth daily. 90 tablet 2  ? rosuvastatin (CRESTOR) 5 MG tablet Take 1 tablet (5 mg total) by mouth every other day. 90 tablet 3  ? timolol (TIMOPTIC) 0.25 % ophthalmic solution 2 drops 2 (two) times daily.    ? ZIOPTAN 0.0015 % SOLN One drop each eye at bed time    ? ?No facility-administered medications prior to visit.  ?  ? ?Allergies:   2,4-d dimethylamine; Metoprolol; Rosuvastatin; Amoxicillin; Aspirin; Cefdinir; Nitrofurantoin; and Sulfa antibiotics  ? ?Social History  ? ?Socioeconomic History  ? Marital status: Married  ?  Spouse name: Not on file  ? Number of children: 2  ? Years of education: Not on file  ? Highest education level: Not on file  ?  Occupational History  ? Not on file  ?Tobacco Use  ? Smoking status: Former  ?  Packs/day: 0.50  ?  Years: 30.00  ?  Pack years: 15.00  ?  Types: Cigarettes  ? Smokeless tobacco: Never  ?Substance and Sexual Activity  ? Alcohol use: No  ?  Alcohol/week: 0.0 standard drinks  ? Drug use: No  ? Sexual activity: Not on file  ?Other Topics Concern  ? Not on file  ?Social History Narrative  ? Not on file  ? ?Social Determinants of Health  ? ?Financial Resource Strain: Not on file  ?Food Insecurity: Not on file  ?Transportation Needs: Not on file  ?Physical Activity: Not on file  ?Stress: Not on file  ?Social  Connections: Not on file  ?  ? ?Family History:  The patient's family history includes Cancer in her mother; Heart attack in her father; Heart disease in her brother and sister; Other in her daughter, sister, and son.  ? ?ROS:   ?Please see the history of present illness.    ?ROS All other systems reviewed and are negative. ? ? ?PHYSICAL EXAM:   ?VS:  BP (!) 140/50 (BP Location: Left Arm)   Pulse 64   Ht 4' 11.5" (1.511 m)   Wt 134 lb (60.8 kg)   SpO2 95%   BMI 26.61 kg/m?    ?GENERAL:  Well appearing WF in NAD ?HEENT:  PERRL, EOMI, sclera are clear. Oropharynx is clear. ?NECK:  No jugular venous distention, carotid upstroke brisk and symmetric, no bruits, no thyromegaly or adenopathy ?LUNGS:  Clear to auscultation bilaterally ?CHEST:  Unremarkable ?HEART:  RRR,  PMI not displaced or sustained,S1 and S2 within normal limits, no S3, no S4: no clicks, no rubs, no murmurs ?ABD:  Soft, nontender. BS +, no masses or bruits. No hepatomegaly, no splenomegaly ?EXT:  2 + pulses throughout, no edema, no cyanosis no clubbing ?SKIN:  Warm and dry.  No rashes ?NEURO:  Alert and oriented x 3. Cranial nerves II through XII intact. ?PSYCH:  Cognitively intact ? ? ? ?Wt Readings from Last 3 Encounters:  ?09/05/21 134 lb (60.8 kg)  ?09/08/20 139 lb 9.6 oz (63.3 kg)  ?08/04/19 141 lb (64 kg)  ?  ? ? ?Studies/Labs Reviewed:  ? ?EKG:  EKG is ordered today. NSR 64 bpm RBBB.  have personally reviewed and interpreted this study. ? ? ?Recent Labs: ?No results found for requested labs within last 8760 hours.  ? ?Lipid Panel ?No results found for: CHOL, TRIG, HDL, CHOLHDL, VLDL, LDLCALC, LDLDIRECT ? ?Additional studies/ records that were reviewed today include:  ? ?Labs dated 02/16/16: Cholesterol 202, triglycerides 109, HDL 55, LDL 125, glucose 128. Other chemistries and CBC normal. Last TSH normal. ?Dated 08/26/17: CMET, CBC normal. A1c 6.2%. cholesterol 124, triglycerides 87, HDL 50, LDL 57.  ?Dated 01/05/19: Normal CBC and TSH.  ?Dated  02/24/19: Normal CMET except glucose 128.  ?Dated 12/24/19: CMET, CBC, TSH normal. Cholesterol 145, triglycerides 101, HDL 44, LDL 82.  ?Dated 04/07/21: cholesterol 129, triglycerides 87, HDL 48, LDL 64. Glucose 123, A1c 6.6%. CMET, TSH, CBC normal.  ? ?Myoview 03/03/2015 ?Study Highlights  ?  ?The left ventricular ejection fraction is normal (55-65%). ?Nuclear stress EF: 66%. ?ST segment depression of 2 mm was noted during stress in the II, aVF, III, V5 and V6 leads, beginning at 4 minutes of stress. ?T wave inversion was noted during stress. ?The study is normal. ?This is a low risk study. ?  ?  1. Nl Perfusion and EF ?2.Positive GXT ?3. Low risk study. Can not R/O balanced ischemia  ?  ? ? ? ?Echo 04/09/2016 ?LV EF: 65% -   70% ?  ?- Left ventricle: The cavity size was normal. Systolic function was ?  vigorous. The estimated ejection fraction was in the range of 65% ?  to 70%. Wall motion was normal; there were no regional wall ?  motion abnormalities. Doppler parameters are consistent with ?  abnormal left ventricular relaxation (grade 1 diastolic ?  dysfunction). Doppler parameters are consistent with elevated ?  ventricular end-diastolic filling pressure. ?- Aortic valve: Trileaflet; normal thickness leaflets. There was no ?  regurgitation. ?- Aortic root: The aortic root was normal in size. ?- Ascending aorta: The ascending aorta was normal in size. ?- Mitral valve: Calcified annulus. Mildly thickened leaflets . ?  There was trivial regurgitation. ?- Left atrium: The atrium was normal in size. ?- Right ventricle: Systolic function was normal. ?- Tricuspid valve: There was mild regurgitation. ?- Pulmonic valve: There was no regurgitation. ?- Pulmonary arteries: Systolic pressure was within the normal ?  range. ?- Inferior vena cava: The vessel was normal in size. ?- Pericardium, extracardiac: There was no pericardial effusion. ? ? ?ASSESSMENT:   ? ?1. Essential hypertension   ?2. Hyperlipidemia, unspecified  hyperlipidemia type   ? ? ? ?PLAN:  ?In order of problems listed above: ? ?HTN well controlled. Continue losartan ? ?HLD: on low dose Crestor. Controlled.  ? ?Hypothyroidism: on Synthroid ? ?4.   History of atypical ches

## 2021-09-05 ENCOUNTER — Encounter: Payer: Self-pay | Admitting: Cardiology

## 2021-09-05 ENCOUNTER — Other Ambulatory Visit: Payer: Self-pay

## 2021-09-05 ENCOUNTER — Ambulatory Visit: Payer: Federal, State, Local not specified - PPO | Admitting: Cardiology

## 2021-09-05 VITALS — BP 140/50 | HR 64 | Ht 59.5 in | Wt 134.0 lb

## 2021-09-05 DIAGNOSIS — E785 Hyperlipidemia, unspecified: Secondary | ICD-10-CM | POA: Diagnosis not present

## 2021-09-05 DIAGNOSIS — I10 Essential (primary) hypertension: Secondary | ICD-10-CM

## 2021-10-20 DIAGNOSIS — H9201 Otalgia, right ear: Secondary | ICD-10-CM | POA: Diagnosis not present

## 2021-10-20 DIAGNOSIS — G43809 Other migraine, not intractable, without status migrainosus: Secondary | ICD-10-CM | POA: Diagnosis not present

## 2021-10-20 DIAGNOSIS — R519 Headache, unspecified: Secondary | ICD-10-CM | POA: Diagnosis not present

## 2021-10-20 DIAGNOSIS — M542 Cervicalgia: Secondary | ICD-10-CM | POA: Diagnosis not present

## 2021-10-26 DIAGNOSIS — H18593 Other hereditary corneal dystrophies, bilateral: Secondary | ICD-10-CM | POA: Diagnosis not present

## 2021-10-26 DIAGNOSIS — H35372 Puckering of macula, left eye: Secondary | ICD-10-CM | POA: Diagnosis not present

## 2021-12-19 DIAGNOSIS — M19171 Post-traumatic osteoarthritis, right ankle and foot: Secondary | ICD-10-CM | POA: Diagnosis not present

## 2021-12-19 DIAGNOSIS — B353 Tinea pedis: Secondary | ICD-10-CM | POA: Diagnosis not present

## 2021-12-19 DIAGNOSIS — M722 Plantar fascial fibromatosis: Secondary | ICD-10-CM | POA: Diagnosis not present

## 2021-12-19 DIAGNOSIS — I83891 Varicose veins of right lower extremities with other complications: Secondary | ICD-10-CM | POA: Diagnosis not present

## 2021-12-20 DIAGNOSIS — I83891 Varicose veins of right lower extremities with other complications: Secondary | ICD-10-CM | POA: Diagnosis not present

## 2021-12-20 DIAGNOSIS — M19071 Primary osteoarthritis, right ankle and foot: Secondary | ICD-10-CM | POA: Diagnosis not present

## 2021-12-20 DIAGNOSIS — M722 Plantar fascial fibromatosis: Secondary | ICD-10-CM | POA: Diagnosis not present

## 2021-12-20 DIAGNOSIS — M79604 Pain in right leg: Secondary | ICD-10-CM | POA: Diagnosis not present

## 2021-12-20 DIAGNOSIS — Z981 Arthrodesis status: Secondary | ICD-10-CM | POA: Diagnosis not present

## 2021-12-20 DIAGNOSIS — M7989 Other specified soft tissue disorders: Secondary | ICD-10-CM | POA: Diagnosis not present

## 2021-12-20 DIAGNOSIS — M7731 Calcaneal spur, right foot: Secondary | ICD-10-CM | POA: Diagnosis not present

## 2021-12-20 DIAGNOSIS — M19171 Post-traumatic osteoarthritis, right ankle and foot: Secondary | ICD-10-CM | POA: Diagnosis not present

## 2021-12-29 ENCOUNTER — Other Ambulatory Visit: Payer: Self-pay

## 2021-12-29 ENCOUNTER — Emergency Department (HOSPITAL_BASED_OUTPATIENT_CLINIC_OR_DEPARTMENT_OTHER)
Admission: EM | Admit: 2021-12-29 | Discharge: 2021-12-29 | Disposition: A | Discharge status: Home / Self Care | Payer: Federal, State, Local not specified - PPO | Attending: Emergency Medicine | Admitting: Emergency Medicine

## 2021-12-29 ENCOUNTER — Encounter (HOSPITAL_BASED_OUTPATIENT_CLINIC_OR_DEPARTMENT_OTHER): Payer: Self-pay

## 2021-12-29 DIAGNOSIS — L03113 Cellulitis of right upper limb: Secondary | ICD-10-CM | POA: Diagnosis not present

## 2021-12-29 DIAGNOSIS — T63461A Toxic effect of venom of wasps, accidental (unintentional), initial encounter: Secondary | ICD-10-CM

## 2021-12-29 DIAGNOSIS — Z79899 Other long term (current) drug therapy: Secondary | ICD-10-CM | POA: Insufficient documentation

## 2021-12-29 DIAGNOSIS — M79601 Pain in right arm: Secondary | ICD-10-CM | POA: Diagnosis not present

## 2021-12-29 MED ORDER — DEXAMETHASONE SODIUM PHOSPHATE 10 MG/ML IJ SOLN
10.0000 mg | Freq: Once | INTRAMUSCULAR | Status: AC
  Administered 2021-12-29: 10 mg via INTRAMUSCULAR
  Filled 2021-12-29: qty 1

## 2021-12-29 MED ORDER — DOXYCYCLINE HYCLATE 100 MG PO TABS
100.0000 mg | ORAL_TABLET | Freq: Once | ORAL | Status: AC
  Administered 2021-12-29: 100 mg via ORAL
  Filled 2021-12-29: qty 1

## 2021-12-29 MED ORDER — DOXYCYCLINE HYCLATE 100 MG PO CAPS: 100.0000 mg | ORAL_CAPSULE | Freq: Two times a day (BID) | ORAL | 0 refills | Status: AC

## 2021-12-29 MED ORDER — HYDROXYZINE HCL 25 MG PO TABS
50.0000 mg | ORAL_TABLET | Freq: Once | ORAL | Status: AC
  Administered 2021-12-29: 50 mg via ORAL
  Filled 2021-12-29: qty 2

## 2021-12-29 MED ORDER — HYDROXYZINE HCL 25 MG PO TABS: 25.0000 mg | ORAL_TABLET | Freq: Three times a day (TID) | ORAL | 0 refills | Status: DC | PRN

## 2021-12-29 NOTE — ED Provider Notes (Signed)
[facility] EMERGENCY DEPT Provider Note   CSN: [id number] Arrival date & time: 12/29/21  1029     History  Chief Complaint  Patient presents with   Insect Bite    Brittany ALLOCCA is a 81 y.o. female presenting emergency department with complaint of a bee sting to the right forearm and redness and swelling of the arm.  The sting occurred 2 days ago.  She said that she tried to "suck the venom out" afterwards.  She has never been stung by bee before.  She noticed that redness has been spreading up her arm since then.  She reports her arm is very itchy.  She denies history of anaphylaxis.  She denies fevers or chills.  HPI     Home Medications Prior to Admission medications   Medication Sig Start Date End Date Taking? Authorizing Provider  doxycycline (VIBRAMYCIN) 100 MG capsule Take 1 capsule (100 mg total) by mouth 2 (two) times daily for 10 days. 12/29/21 01/08/22 Yes Lashina Milles, Kermit Balo, MD  hydrOXYzine (ATARAX) 25 MG tablet Take 1 tablet (25 mg total) by mouth every 8 (eight) hours as needed for up to 30 doses. 12/29/21  Yes Braxton Weisbecker, Kermit Balo, MD  ALPHAGAN P 0.1 % SOLN Place 1 drop into both eyes 3 (three) times daily. 05/30/16   [provider]  esomeprazole (NEXIUM) 20 MG capsule Take by mouth.    [provider]  levothyroxine (SYNTHROID, LEVOTHROID) 125 MCG tablet Take 125 mcg by mouth daily before breakfast.    [provider]  loratadine (CLARITIN) 10 MG tablet Take 10 mg by mouth daily.    [provider]  losartan (COZAAR) 25 MG tablet Take 1 tablet (25 mg total) by mouth daily. 01/31/21   [place], Peter M, MD  rosuvastatin (CRESTOR) 5 MG tablet Take 1 tablet (5 mg total) by mouth every other day. 08/04/19   [place], Peter M, MD  timolol (TIMOPTIC) 0.25 % ophthalmic solution 2 drops 2 (two) times daily. 08/22/21   [provider]  ZIOPTAN 0.0015 % SOLN One drop each eye at bed time 05/24/17   [provider]       Allergies    2,4-d dimethylamine; Metoprolol; Rosuvastatin; Amoxicillin; Aspirin; Benadryl [diphenhydramine]; Cefdinir; Nitrofurantoin; and Sulfa antibiotics    Review of Systems   Review of Systems  Physical Exam Updated Vital Signs BP (!) 184/62 (BP Location: Left Arm)   Pulse 70   Temp 97.7 F (36.5 C)   Resp 16   SpO2 100%  Physical Exam Constitutional:      General: She is not in acute distress. HENT:     Head: Normocephalic and atraumatic.  Eyes:     Conjunctiva/sclera: Conjunctivae normal.     Pupils: Pupils are equal, round, and reactive to light.  Neck:     Comments: Oropharynx non-erythematous.  No tonsillar swelling or exudate.  No uvular deviation.  No drooling. No brawny edema. No stridor. Voice is not muffled. Cardiovascular:     Rate and Rhythm: Normal rate and regular rhythm.  Pulmonary:     Effort: Pulmonary effort is normal. No respiratory distress.  Abdominal:     General: There is no distension.     Tenderness: There is no abdominal tenderness.  Skin:    General: Skin is warm and dry.     Comments: Erythema and edema of the right forearm and upper arm as photographed below, tenderness, no hives anywhere else on the body  Neurological:  General: No focal deficit present.     Mental Status: She is alert. Mental status is at baseline.  Psychiatric:        Mood and Affect: Mood normal.        Behavior: Behavior normal.         ED Results / Procedures / Treatments   Labs (all labs ordered are listed, but only abnormal results are displayed) Labs Reviewed - No data to display  EKG None  Radiology No results found.  Procedures Procedures    Medications Ordered in ED Medications  dexamethasone (DECADRON) injection 10 mg (has no administration in time range)  doxycycline (VIBRA-TABS) tablet 100 mg (has no administration in time range)  hydrOXYzine (ATARAX) tablet 50 mg (has no administration in time range)    ED Course/  Medical Decision Making/ A&P                           Medical Decision Making Risk Prescription drug management.   Patient is here with right arm pain and swelling.  She has significant itching in this arm.  The presenting notes are good allergic reaction versus superimposed infection versus other.  Lower suspicion for DVT with this clinical presentation.    I am concerned about infection as the patient did apply her mouth to the wound directly after injury.  We will start her on doxycycline for 10 days, can also give a shot of intramuscular Decadron to help with swelling and itching, as well as Vistaril.  Unfortunately she reacts very poorly and adversely to typical antihistamines like Benadryl and Zyrtec, which can cause tachycardia unpleasant side effects for her.  She will need very close follow-up with her PCP and will call to arrange this.  I did explain to the patient, as well as her daughter at bedside, that if her symptoms are worsening she will need to come back to the ER may require admission at that time.  They verbalized understanding.  They would like to attempt outpatient management, which is reasonable at this time.        Final Clinical Impression(s) / ED Diagnoses Final diagnoses:  Wasp sting, accidental or unintentional, initial encounter  Right arm pain  Cellulitis of right upper extremity    Rx / DC Orders ED Discharge Orders          Ordered    doxycycline (VIBRAMYCIN) 100 MG capsule  2 times daily        12/29/21 1132    hydrOXYzine (ATARAX) 25 MG tablet  Every 8 hours PRN        12/29/21 1132              Terald Sleeper, MD 12/29/21 1132

## 2021-12-29 NOTE — ED Triage Notes (Signed)
Pt presents POV from home for Right arm edema and itching. Pt reports she was stung by a wasp Wednesday to her RUE

## 2021-12-29 NOTE — Discharge Instructions (Signed)
You are diagnosed with an allergic reaction and cellulitis, or skin infection of your right arm.  You were started on antibiotic which you will need to take for 10 days.  You are also given a shot of steroids to help with the swelling.  Vistaril was prescribed to help with itching.  You will need very close follow-up with your doctors office.  Please call them today to arrange for follow-up appointment on Monday if possible.  We discussed return precautions to the ER.  If the redness continues spreading, you begin having fevers, lose feeling in your hand, you need to come back to the ER immediately.  This is the potential becoming a more serious medical condition without appropriate treatment.

## 2021-12-31 ENCOUNTER — Other Ambulatory Visit: Payer: Self-pay | Admitting: Cardiology

## 2022-01-01 DIAGNOSIS — Z9289 Personal history of other medical treatment: Secondary | ICD-10-CM | POA: Diagnosis not present

## 2022-01-01 DIAGNOSIS — T63461D Toxic effect of venom of wasps, accidental (unintentional), subsequent encounter: Secondary | ICD-10-CM | POA: Diagnosis not present

## 2022-01-01 DIAGNOSIS — L03113 Cellulitis of right upper limb: Secondary | ICD-10-CM | POA: Diagnosis not present

## 2022-01-03 DIAGNOSIS — H401132 Primary open-angle glaucoma, bilateral, moderate stage: Secondary | ICD-10-CM | POA: Diagnosis not present

## 2022-01-08 DIAGNOSIS — M810 Age-related osteoporosis without current pathological fracture: Secondary | ICD-10-CM | POA: Diagnosis not present

## 2022-01-08 DIAGNOSIS — L03113 Cellulitis of right upper limb: Secondary | ICD-10-CM | POA: Diagnosis not present

## 2022-01-08 DIAGNOSIS — T63461D Toxic effect of venom of wasps, accidental (unintentional), subsequent encounter: Secondary | ICD-10-CM | POA: Diagnosis not present

## 2022-01-19 DIAGNOSIS — M8588 Other specified disorders of bone density and structure, other site: Secondary | ICD-10-CM | POA: Diagnosis not present

## 2022-01-19 DIAGNOSIS — M85851 Other specified disorders of bone density and structure, right thigh: Secondary | ICD-10-CM | POA: Diagnosis not present

## 2022-01-19 DIAGNOSIS — M810 Age-related osteoporosis without current pathological fracture: Secondary | ICD-10-CM | POA: Diagnosis not present

## 2022-01-19 DIAGNOSIS — M85832 Other specified disorders of bone density and structure, left forearm: Secondary | ICD-10-CM | POA: Diagnosis not present

## 2022-01-23 DIAGNOSIS — M722 Plantar fascial fibromatosis: Secondary | ICD-10-CM | POA: Diagnosis not present

## 2022-01-23 DIAGNOSIS — M19071 Primary osteoarthritis, right ankle and foot: Secondary | ICD-10-CM | POA: Diagnosis not present

## 2022-01-23 DIAGNOSIS — S82841S Displaced bimalleolar fracture of right lower leg, sequela: Secondary | ICD-10-CM | POA: Diagnosis not present

## 2022-01-29 DIAGNOSIS — N390 Urinary tract infection, site not specified: Secondary | ICD-10-CM | POA: Diagnosis not present

## 2022-01-29 DIAGNOSIS — R300 Dysuria: Secondary | ICD-10-CM | POA: Diagnosis not present

## 2022-01-29 DIAGNOSIS — M85851 Other specified disorders of bone density and structure, right thigh: Secondary | ICD-10-CM | POA: Diagnosis not present

## 2022-01-29 DIAGNOSIS — R319 Hematuria, unspecified: Secondary | ICD-10-CM | POA: Diagnosis not present

## 2022-02-13 DIAGNOSIS — T63464A Toxic effect of venom of wasps, undetermined, initial encounter: Secondary | ICD-10-CM | POA: Diagnosis not present

## 2022-02-16 DIAGNOSIS — R300 Dysuria: Secondary | ICD-10-CM | POA: Diagnosis not present

## 2022-03-06 DIAGNOSIS — H20012 Primary iridocyclitis, left eye: Secondary | ICD-10-CM | POA: Diagnosis not present

## 2022-03-07 DIAGNOSIS — H20012 Primary iridocyclitis, left eye: Secondary | ICD-10-CM | POA: Diagnosis not present

## 2022-03-09 DIAGNOSIS — H20012 Primary iridocyclitis, left eye: Secondary | ICD-10-CM | POA: Diagnosis not present

## 2022-03-14 DIAGNOSIS — H20012 Primary iridocyclitis, left eye: Secondary | ICD-10-CM | POA: Diagnosis not present

## 2022-03-21 DIAGNOSIS — S82841S Displaced bimalleolar fracture of right lower leg, sequela: Secondary | ICD-10-CM | POA: Diagnosis not present

## 2022-03-21 DIAGNOSIS — Z472 Encounter for removal of internal fixation device: Secondary | ICD-10-CM | POA: Diagnosis not present

## 2022-03-21 DIAGNOSIS — K219 Gastro-esophageal reflux disease without esophagitis: Secondary | ICD-10-CM | POA: Diagnosis not present

## 2022-03-21 DIAGNOSIS — E785 Hyperlipidemia, unspecified: Secondary | ICD-10-CM | POA: Diagnosis not present

## 2022-03-21 DIAGNOSIS — E039 Hypothyroidism, unspecified: Secondary | ICD-10-CM | POA: Diagnosis not present

## 2022-03-21 DIAGNOSIS — T8484XA Pain due to internal orthopedic prosthetic devices, implants and grafts, initial encounter: Secondary | ICD-10-CM | POA: Diagnosis not present

## 2022-03-21 DIAGNOSIS — Z882 Allergy status to sulfonamides status: Secondary | ICD-10-CM | POA: Diagnosis not present

## 2022-03-21 DIAGNOSIS — M19071 Primary osteoarthritis, right ankle and foot: Secondary | ICD-10-CM | POA: Diagnosis not present

## 2022-03-21 DIAGNOSIS — Y828 Other medical devices associated with adverse incidents: Secondary | ICD-10-CM | POA: Diagnosis not present

## 2022-03-21 DIAGNOSIS — X58XXXA Exposure to other specified factors, initial encounter: Secondary | ICD-10-CM | POA: Diagnosis not present

## 2022-03-21 DIAGNOSIS — Z888 Allergy status to other drugs, medicaments and biological substances status: Secondary | ICD-10-CM | POA: Diagnosis not present

## 2022-03-21 DIAGNOSIS — M24073 Loose body in unspecified ankle: Secondary | ICD-10-CM | POA: Diagnosis not present

## 2022-03-21 DIAGNOSIS — Z969 Presence of functional implant, unspecified: Secondary | ICD-10-CM | POA: Diagnosis not present

## 2022-03-21 DIAGNOSIS — Z87891 Personal history of nicotine dependence: Secondary | ICD-10-CM | POA: Diagnosis not present

## 2022-03-21 DIAGNOSIS — M25871 Other specified joint disorders, right ankle and foot: Secondary | ICD-10-CM | POA: Diagnosis not present

## 2022-03-21 DIAGNOSIS — Z881 Allergy status to other antibiotic agents status: Secondary | ICD-10-CM | POA: Diagnosis not present

## 2022-03-21 DIAGNOSIS — R739 Hyperglycemia, unspecified: Secondary | ICD-10-CM | POA: Diagnosis not present

## 2022-03-21 DIAGNOSIS — M24071 Loose body in right ankle: Secondary | ICD-10-CM | POA: Diagnosis not present

## 2022-03-21 DIAGNOSIS — I10 Essential (primary) hypertension: Secondary | ICD-10-CM | POA: Diagnosis not present

## 2022-03-21 DIAGNOSIS — Z79890 Hormone replacement therapy (postmenopausal): Secondary | ICD-10-CM | POA: Diagnosis not present

## 2022-06-25 DIAGNOSIS — H401132 Primary open-angle glaucoma, bilateral, moderate stage: Secondary | ICD-10-CM | POA: Diagnosis not present

## 2022-06-25 DIAGNOSIS — H18593 Other hereditary corneal dystrophies, bilateral: Secondary | ICD-10-CM | POA: Diagnosis not present

## 2022-06-26 DIAGNOSIS — T8484XA Pain due to internal orthopedic prosthetic devices, implants and grafts, initial encounter: Secondary | ICD-10-CM | POA: Diagnosis not present

## 2022-08-30 DIAGNOSIS — Z1330 Encounter for screening examination for mental health and behavioral disorders, unspecified: Secondary | ICD-10-CM | POA: Diagnosis not present

## 2022-08-30 DIAGNOSIS — H401121 Primary open-angle glaucoma, left eye, mild stage: Secondary | ICD-10-CM | POA: Diagnosis not present

## 2022-08-30 DIAGNOSIS — R739 Hyperglycemia, unspecified: Secondary | ICD-10-CM | POA: Diagnosis not present

## 2022-08-30 DIAGNOSIS — G4452 New daily persistent headache (NDPH): Secondary | ICD-10-CM | POA: Diagnosis not present

## 2022-08-30 DIAGNOSIS — R030 Elevated blood-pressure reading, without diagnosis of hypertension: Secondary | ICD-10-CM | POA: Diagnosis not present

## 2022-08-31 DIAGNOSIS — G4452 New daily persistent headache (NDPH): Secondary | ICD-10-CM | POA: Diagnosis not present

## 2022-08-31 DIAGNOSIS — G319 Degenerative disease of nervous system, unspecified: Secondary | ICD-10-CM | POA: Diagnosis not present

## 2022-09-29 ENCOUNTER — Emergency Department (HOSPITAL_COMMUNITY): Payer: Federal, State, Local not specified - PPO

## 2022-09-29 ENCOUNTER — Other Ambulatory Visit: Payer: Self-pay

## 2022-09-29 ENCOUNTER — Emergency Department (HOSPITAL_COMMUNITY)
Admission: EM | Admit: 2022-09-29 | Discharge: 2022-09-29 | Disposition: A | Discharge status: Home / Self Care | Payer: Federal, State, Local not specified - PPO | Attending: Emergency Medicine | Admitting: Emergency Medicine

## 2022-09-29 ENCOUNTER — Encounter (HOSPITAL_COMMUNITY): Payer: Self-pay

## 2022-09-29 DIAGNOSIS — Z79899 Other long term (current) drug therapy: Secondary | ICD-10-CM | POA: Insufficient documentation

## 2022-09-29 DIAGNOSIS — I700 Atherosclerosis of aorta: Secondary | ICD-10-CM | POA: Diagnosis not present

## 2022-09-29 DIAGNOSIS — K5792 Diverticulitis of intestine, part unspecified, without perforation or abscess without bleeding: Secondary | ICD-10-CM | POA: Diagnosis not present

## 2022-09-29 DIAGNOSIS — R1031 Right lower quadrant pain: Secondary | ICD-10-CM | POA: Diagnosis not present

## 2022-09-29 DIAGNOSIS — K5732 Diverticulitis of large intestine without perforation or abscess without bleeding: Secondary | ICD-10-CM | POA: Insufficient documentation

## 2022-09-29 DIAGNOSIS — E039 Hypothyroidism, unspecified: Secondary | ICD-10-CM | POA: Diagnosis not present

## 2022-09-29 DIAGNOSIS — I10 Essential (primary) hypertension: Secondary | ICD-10-CM | POA: Diagnosis not present

## 2022-09-29 LAB — CBC WITH DIFFERENTIAL/PLATELET
Abs Immature Granulocytes: 0.02 10*3/uL (ref 0.00–0.07)
Basophils Absolute: 0.1 10*3/uL (ref 0.0–0.1)
Basophils Relative: 1 %
Eosinophils Absolute: 0.1 10*3/uL (ref 0.0–0.5)
Eosinophils Relative: 1 %
HCT: 46 % (ref 36.0–46.0)
Hemoglobin: 14.5 g/dL (ref 12.0–15.0)
Immature Granulocytes: 0 %
Lymphocytes Relative: 12 %
Lymphs Abs: 1.2 10*3/uL (ref 0.7–4.0)
MCH: 27.7 pg (ref 26.0–34.0)
MCHC: 31.5 g/dL (ref 30.0–36.0)
MCV: 87.8 fL (ref 80.0–100.0)
Monocytes Absolute: 1.1 10*3/uL — ABNORMAL HIGH (ref 0.1–1.0)
Monocytes Relative: 11 %
Neutro Abs: 7.7 10*3/uL (ref 1.7–7.7)
Neutrophils Relative %: 75 %
Platelets: 149 10*3/uL — ABNORMAL LOW (ref 150–400)
RBC: 5.24 MIL/uL — ABNORMAL HIGH (ref 3.87–5.11)
RDW: 15.4 % (ref 11.5–15.5)
WBC: 10.2 10*3/uL (ref 4.0–10.5)
nRBC: 0 % (ref 0.0–0.2)

## 2022-09-29 LAB — I-STAT CHEM 8, ED
BUN: 13 mg/dL (ref 8–23)
Calcium, Ion: 1.18 mmol/L (ref 1.15–1.40)
Chloride: 101 mmol/L (ref 98–111)
Creatinine, Ser: 0.7 mg/dL (ref 0.44–1.00)
Glucose, Bld: 110 mg/dL — ABNORMAL HIGH (ref 70–99)
HCT: 39 % (ref 36.0–46.0)
Hemoglobin: 13.3 g/dL (ref 12.0–15.0)
Potassium: 4.3 mmol/L (ref 3.5–5.1)
Sodium: 137 mmol/L (ref 135–145)
TCO2: 24 mmol/L (ref 22–32)

## 2022-09-29 LAB — URINALYSIS, W/ REFLEX TO CULTURE (INFECTION SUSPECTED)
Bacteria, UA: NONE SEEN
Bilirubin Urine: NEGATIVE
Glucose, UA: NEGATIVE mg/dL
Hgb urine dipstick: NEGATIVE
Ketones, ur: NEGATIVE mg/dL
Leukocytes,Ua: NEGATIVE
Nitrite: NEGATIVE
Protein, ur: NEGATIVE mg/dL
Specific Gravity, Urine: 1.004 — ABNORMAL LOW (ref 1.005–1.030)
pH: 6 (ref 5.0–8.0)

## 2022-09-29 MED ORDER — IOHEXOL 300 MG/ML  SOLN
100.0000 mL | Freq: Once | INTRAMUSCULAR | Status: AC | PRN
  Administered 2022-09-29: 100 mL via INTRAVENOUS

## 2022-09-29 MED ORDER — CIPROFLOXACIN HCL 500 MG PO TABS
500.0000 mg | ORAL_TABLET | Freq: Once | ORAL | Status: AC
  Administered 2022-09-29: 500 mg via ORAL
  Filled 2022-09-29: qty 1

## 2022-09-29 MED ORDER — CIPROFLOXACIN HCL 500 MG PO TABS: 500.0000 mg | ORAL_TABLET | Freq: Two times a day (BID) | ORAL | 0 refills | Status: DC

## 2022-09-29 MED ORDER — METRONIDAZOLE 500 MG PO TABS: 500.0000 mg | ORAL_TABLET | Freq: Two times a day (BID) | ORAL | 0 refills | Status: AC

## 2022-09-29 MED ORDER — METRONIDAZOLE 500 MG PO TABS
500.0000 mg | ORAL_TABLET | Freq: Once | ORAL | Status: AC
  Administered 2022-09-29: 500 mg via ORAL
  Filled 2022-09-29: qty 1

## 2022-09-29 NOTE — ED Provider Notes (Signed)
[place] EMERGENCY DEPARTMENT AT [facility] Provider Note   CSN: [id number] Arrival date & time: 09/29/22  1335     History  Chief Complaint  Patient presents with   Abdominal Pain    ANALEISE MCCLEERY is a 82 y.o. female.  Patient is a 82 year old female with a history of hypertension, hyperlipidemia, GERD and hypothyroidism who presents with lower abdominal pain.  She said it started last night but got worse today.  She said it is a sometimes sharp but mostly dull pain in her lower abdomen, more on the right side.  She also has a little bit of burning on urination and a little bit of dribbling after she urinates.  No associated back pain.  No nausea or vomiting.  She had a little bit of loose stool today.  No known fevers.  She does have a prior history of diverticulitis.       Home Medications Prior to Admission medications   Medication Sig Start Date End Date Taking? Authorizing Provider  ciprofloxacin (CIPRO) 500 MG tablet Take 1 tablet (500 mg total) by mouth 2 (two) times daily. 09/29/22  Yes Rolan Bucco, MD  metroNIDAZOLE (FLAGYL) 500 MG tablet Take 1 tablet (500 mg total) by mouth 2 (two) times daily. One po bid x 7 days 09/29/22  Yes Davied Nocito, Shawna Orleans, MD  ALPHAGAN P 0.1 % SOLN Place 1 drop into both eyes 3 (three) times daily. 05/30/16   [provider]  esomeprazole (NEXIUM) 20 MG capsule Take by mouth.    [provider]  hydrOXYzine (ATARAX) 25 MG tablet Take 1 tablet (25 mg total) by mouth every 8 (eight) hours as needed for up to 30 doses. 12/29/21   Terald Sleeper, MD  levothyroxine (SYNTHROID, LEVOTHROID) 125 MCG tablet Take 125 mcg by mouth daily before breakfast.    [provider]  loratadine (CLARITIN) 10 MG tablet Take 10 mg by mouth daily.    [provider]  losartan (COZAAR) 25 MG tablet TAKE 1 TABLET DAILY 01/01/22   [place], Peter M, MD  rosuvastatin (CRESTOR) 5 MG tablet Take 1 tablet (5 mg total) by mouth  every other day. 08/04/19   [place], Peter M, MD  timolol (TIMOPTIC) 0.25 % ophthalmic solution 2 drops 2 (two) times daily. 08/22/21   [provider]  ZIOPTAN 0.0015 % SOLN One drop each eye at bed time 05/24/17   [provider]      Allergies    Diphenhydramine; 2,4-d dimethylamine; Aspirin; Metoprolol; Rosuvastatin; Amoxicillin; Cefdinir; Nitrofurantoin; and Sulfa antibiotics    Review of Systems   Review of Systems  Constitutional:  Negative for chills, diaphoresis, fatigue and fever.  HENT:  Negative for congestion, rhinorrhea and sneezing.   Eyes: Negative.   Respiratory:  Negative for cough, chest tightness and shortness of breath.   Cardiovascular:  Negative for chest pain and leg swelling.  Gastrointestinal:  Positive for abdominal pain. Negative for blood in stool, diarrhea, nausea and vomiting.       1 loose stool  Genitourinary:  Positive for dysuria. Negative for difficulty urinating, flank pain, frequency and hematuria.  Musculoskeletal:  Negative for arthralgias and back pain.  Skin:  Negative for rash.  Neurological:  Negative for dizziness, speech difficulty, weakness, numbness and headaches.    Physical Exam Updated Vital Signs BP (!) 162/56   Pulse (!) 55   Temp 99.5 F (37.5 C) (Oral)   Resp 17   Ht 4' 11.5" (1.511 m)  Wt 60.8 kg   SpO2 100%   BMI 26.61 kg/m  Physical Exam Constitutional:      Appearance: She is well-developed.  HENT:     Head: Normocephalic and atraumatic.  Eyes:     Pupils: Pupils are equal, round, and reactive to light.  Cardiovascular:     Rate and Rhythm: Normal rate and regular rhythm.     Heart sounds: Normal heart sounds.  Pulmonary:     Effort: Pulmonary effort is normal. No respiratory distress.     Breath sounds: Normal breath sounds. No wheezing or rales.  Chest:     Chest wall: No tenderness.  Abdominal:     General: Bowel sounds are normal.     Palpations: Abdomen is soft.     Tenderness: There  is abdominal tenderness in the right lower quadrant. There is no guarding or rebound.  Musculoskeletal:        General: Normal range of motion.     Cervical back: Normal range of motion and neck supple.     Comments: Left lower leg has trace edema, right lower leg has 1+ edema.  Patient says this is chronic due to a prior fracture in her leg and unchanged from her baseline.  No warmth or erythema.  Lymphadenopathy:     Cervical: No cervical adenopathy.  Skin:    General: Skin is warm and dry.     Findings: No rash.  Neurological:     Mental Status: She is alert and oriented to person, place, and time.     ED Results / Procedures / Treatments   Labs (all labs ordered are listed, but only abnormal results are displayed) Labs Reviewed  CBC WITH DIFFERENTIAL/PLATELET - Abnormal; Notable for the following components:      Result Value   RBC 5.24 (*)    Platelets 149 (*)    Monocytes Absolute 1.1 (*)    All other components within normal limits  URINALYSIS, W/ REFLEX TO CULTURE (INFECTION SUSPECTED) - Abnormal; Notable for the following components:   Color, Urine STRAW (*)    Specific Gravity, Urine 1.004 (*)    All other components within normal limits  I-STAT CHEM 8, ED - Abnormal; Notable for the following components:   Glucose, Bld 110 (*)    All other components within normal limits    EKG None  Radiology CT ABDOMEN PELVIS W CONTRAST  Result Date: 09/29/2022 CLINICAL DATA:  Right lower quadrant pain.  Dysuria. EXAM: CT ABDOMEN AND PELVIS WITH CONTRAST TECHNIQUE: Multidetector CT imaging of the abdomen and pelvis was performed using the standard protocol following bolus administration of intravenous contrast. RADIATION DOSE REDUCTION: This exam was performed according to the departmental dose-optimization program which includes automated exposure control, adjustment of the mA and/or kV according to patient size and/or use of iterative reconstruction technique. CONTRAST:   OMNIPAQUE IOHEXOL 300 MG/ML  SOLN COMPARISON:  Noncontrast CT on 06/14/2010 FINDINGS: Lower Chest: No acute findings. Hepatobiliary: No hepatic masses identified. Gallbladder is unremarkable. No evidence of biliary ductal dilatation. Pancreas:  No mass or inflammatory changes. Spleen: Within normal limits in size and appearance. Adrenals/Urinary Tract: No suspicious masses identified. No evidence of ureteral calculi or hydronephrosis. Unremarkable unopacified urinary bladder. Stomach/Bowel: Mild diverticulitis is seen involving the sigmoid colon. No evidence of perforation or abscess. No evidence of bowel obstruction. Normal appendix visualized. Vascular/Lymphatic: No pathologically enlarged lymph nodes. No acute vascular findings. Aortic atherosclerotic calcification incidentally noted. Reproductive:  No mass or other significant  abnormality. Other:  None. Musculoskeletal:  No suspicious bone lesions identified. IMPRESSION: Mild sigmoid diverticulitis. No evidence of perforation or abscess. Aortic Atherosclerosis (ICD10-I70.0). Electronically Signed   By: Danae Orleans M.D.   On: 09/29/2022 16:12    Procedures Procedures    Medications Ordered in ED Medications  iohexol (OMNIPAQUE) 300 MG/ML solution 100 mL (100 mLs Intravenous Contrast Given 09/29/22 1535)  ciprofloxacin (CIPRO) tablet 500 mg (500 mg Oral Given 09/29/22 1806)  metroNIDAZOLE (FLAGYL) tablet 500 mg (500 mg Oral Given 09/29/22 1806)    ED Course/ Medical Decision Making/ A&P                             Medical Decision Making Amount and/or Complexity of Data Reviewed Labs: ordered. Radiology: ordered.  Risk Prescription drug management.   Patient is 81-year female who presents with some lower abdominal pain and urinary symptoms.  Her urine is not consistent with infection.  I-STAT labs are nonconcerning.  Her creatinine is normal.  Electrolytes are nonconcerning.  Her hemoglobin is stable.  Initially a CMP has been ordered as  well as a lipase.  However lab was having extensive trouble with there chemistry machines and it was taking extended amount of time to get these.  She does not have any pain around her upper abdomen so I have a very low suspicion of other etiologies such as pancreatitis or hepatitis.  Given the long delay, these were canceled.  She had a CT scan of her abdomen pelvis which shows evidence of mild diverticulitis.  No signs of perforation or abscess.  She is otherwise well-appearing and at this point I do not see that she needs inpatient treatment.  She was given a dose of antibiotics here in the ED.  She has a penicillin allergy and was started on Cipro and Flagyl.  She was discharged home in good condition.  She was given prescription for Cipro and Flagyl.  She has a gastroenterologist that she will follow-up with.  Return precautions were given.  Final Clinical Impression(s) / ED Diagnoses Final diagnoses:  Diverticulitis    Rx / DC Orders ED Discharge Orders          Ordered    ciprofloxacin (CIPRO) 500 MG tablet  2 times daily        09/29/22 1753    metroNIDAZOLE (FLAGYL) 500 MG tablet  2 times daily        09/29/22 1753              Rolan Bucco, MD 09/29/22 [phone]

## 2022-09-29 NOTE — ED Triage Notes (Signed)
Generalized abd pain, urinary frequency, dribbling, and burning with urination x1 day Non-tender to palpitation  Denies n/v

## 2022-10-02 NOTE — Progress Notes (Unsigned)
   Cardiology Clinic Note   Date: 10/02/2022 ID: Brittany Sawyer, DOB 1940-12-24, MRN [id number]  Primary Cardiologist:  None  Patient Profile    Brittany Sawyer is a 82 y.o. female who presents to the clinic today for ER follow-up.  Past medical history significant for: Chest pain. Nuclear stress test 03/03/2015: LVEF 55 to 60%.  ST segment depression of 2 mm noted during stress and 2, aVF, 3, V5 and V6 leads beginning at 4 minutes of stress.  T wave inversion was noted during stress.  The study is normal, low risk. Echo 04/09/2016: EF 65 to 70%.  Grade I DD.  Elevated LVEDP.  Trivial MR.  Mild TR. RBBB. Hypertension. Hyperlipidemia.***   History of Present Illness    Brittany Sawyer was first evaluated by Dr. [place]and on 02/15/2015 for chest tightness at the request of Dr. Cyndia Bent.  She underwent nuclear stress test which was a low risk study but showed 70 EKG changes and a hypertensive BP response.  Patient was started on medical therapy.  She continues to be followed by Dr. Swaziland for the above outlined history.  Patient was last seen in the office by Dr. Swaziland on 09/05/2021.  She was doing well at that time and no medication changes were made.  Patient was seen in the ED on 09/29/2022 for abdominal pain.  CT abdomen pelvis showed evidence of mild diverticulitis and patient was discharged with Cipro and Flagyl.  Today, patient ***  Hypertension.  BP today*** Patient denies headaches or dizziness.  Continue losartan. Hyperlipidemia.***   ROS: All other systems reviewed and are otherwise negative except as noted in History of Present Illness.  Studies Reviewed    ECG personally reviewed by me today: ***  No significant changes from ***  Risk Assessment/Calculations    {Does this patient have ATRIAL FIBRILLATION?:[phone]} No BP recorded.  {Refresh Note OR Click here to enter BP  :1}***        Physical Exam    VS:  There were no vitals taken for this visit. , BMI There is no  height or weight on file to calculate BMI.  GEN: Well nourished, well developed, in no acute distress. Neck: No JVD or carotid bruits. Cardiac: *** RRR. No murmurs. No rubs or gallops.   Respiratory:  Respirations regular and unlabored. Clear to auscultation without rales, wheezing or rhonchi. GI: Soft, nontender, nondistended. Extremities: Radials/DP/PT 2+ and equal bilaterally. No clubbing or cyanosis. No edema ***  Skin: Warm and dry, no rash. Neuro: Strength intact.  Assessment & Plan   ***  Disposition: ***     {Are you ordering a CV Procedure (e.g. stress test, cath, DCCV, TEE, etc)?   Press F2        :[id number]}   Signed, Etta Grandchild. Oluwatobi Ruppe, DNP, NP-C

## 2022-10-03 ENCOUNTER — Encounter: Payer: Self-pay | Admitting: Student

## 2022-10-03 ENCOUNTER — Ambulatory Visit: Payer: Federal, State, Local not specified - PPO | Attending: Student | Admitting: Student

## 2022-10-03 VITALS — BP 152/60 | HR 66 | Ht 60.0 in | Wt 136.2 lb

## 2022-10-03 DIAGNOSIS — I10 Essential (primary) hypertension: Secondary | ICD-10-CM | POA: Diagnosis not present

## 2022-10-03 DIAGNOSIS — E782 Mixed hyperlipidemia: Secondary | ICD-10-CM | POA: Diagnosis not present

## 2022-10-03 NOTE — Patient Instructions (Signed)
Medication Instructions:  Your physician recommends that you continue on your current medications as directed. Please refer to the Current Medication list given to you today.  *If you need a refill on your cardiac medications before your next appointment, please call your pharmacy*   Lab Work: NONE If you have labs (blood work) drawn today and your tests are completely normal, you will receive your results only by: MyChart Message (if you have MyChart) OR A paper copy in the mail If you have any lab test that is abnormal or we need to change your treatment, we will call you to review the results.   Testing/Procedures: NONE   Follow-Up: At [facility], you and your health needs are our priority.  As part of our continuing mission to provide you with exceptional heart care, we have created designated Provider Care Teams.  These Care Teams include your primary Cardiologist (physician) and Advanced Practice Providers (APPs -  Physician Assistants and Nurse Practitioners) who all work together to provide you with the care you need, when you need it.  We recommend signing up for the patient portal called "MyChart".  Sign up information is provided on this After Visit Summary.  MyChart is used to connect with patients for Virtual Visits (Telemedicine).  Patients are able to view lab/test results, encounter notes, upcoming appointments, etc.  Non-urgent messages can be sent to your provider as well.   To learn more about what you can do with MyChart, go to [email or web address].    Your next appointment:   1 year(s)  Provider:   Peter Jordan, MD    

## 2022-10-08 DIAGNOSIS — R109 Unspecified abdominal pain: Secondary | ICD-10-CM | POA: Diagnosis not present

## 2022-10-08 DIAGNOSIS — K5792 Diverticulitis of intestine, part unspecified, without perforation or abscess without bleeding: Secondary | ICD-10-CM | POA: Diagnosis not present

## 2022-11-28 DIAGNOSIS — E7801 Familial hypercholesterolemia: Secondary | ICD-10-CM | POA: Diagnosis not present

## 2022-11-28 DIAGNOSIS — H16223 Keratoconjunctivitis sicca, not specified as Sjogren's, bilateral: Secondary | ICD-10-CM | POA: Diagnosis not present

## 2022-12-19 DIAGNOSIS — R10813 Right lower quadrant abdominal tenderness: Secondary | ICD-10-CM | POA: Diagnosis not present

## 2022-12-19 DIAGNOSIS — K5792 Diverticulitis of intestine, part unspecified, without perforation or abscess without bleeding: Secondary | ICD-10-CM | POA: Diagnosis not present

## 2022-12-19 DIAGNOSIS — M7918 Myalgia, other site: Secondary | ICD-10-CM | POA: Diagnosis not present

## 2023-01-22 ENCOUNTER — Other Ambulatory Visit: Payer: Self-pay | Admitting: Cardiology

## 2023-07-05 ENCOUNTER — Other Ambulatory Visit: Payer: Self-pay | Admitting: Gastroenterology

## 2023-07-05 DIAGNOSIS — R109 Unspecified abdominal pain: Secondary | ICD-10-CM

## 2023-07-29 ENCOUNTER — Ambulatory Visit
Admission: RE | Admit: 2023-07-29 | Discharge: 2023-07-29 | Disposition: A | Discharge status: Home / Self Care | Payer: Federal, State, Local not specified - PPO | Source: Ambulatory Visit | Attending: Gastroenterology | Admitting: Gastroenterology

## 2023-07-29 DIAGNOSIS — R109 Unspecified abdominal pain: Secondary | ICD-10-CM

## 2023-09-24 ENCOUNTER — Encounter: Payer: Self-pay | Admitting: Cardiology

## 2023-10-17 ENCOUNTER — Other Ambulatory Visit: Payer: Self-pay | Admitting: Cardiology

## 2023-10-17 ENCOUNTER — Telehealth: Payer: Self-pay | Admitting: Cardiology

## 2023-10-17 MED ORDER — LOSARTAN POTASSIUM 25 MG PO TABS: 25.0000 mg | ORAL_TABLET | Freq: Every day | ORAL | 0 refills | Status: DC

## 2023-10-17 NOTE — Telephone Encounter (Signed)
 Pt's medication was sent to pt's pharmacy as requested. Confirmation received.

## 2023-10-17 NOTE — Telephone Encounter (Signed)
*  STAT* If patient is at the pharmacy, call can be transferred to refill team.   1. Which medications need to be refilled? (please list name of each medication and dose if known) losartan  (COZAAR ) 25 MG tablet    2. Would you like to learn more about the convenience, safety, & potential cost savings by using the [facility]? No   3. Are you open to using the [facility] (Type [facility].) No   4. Which pharmacy/location (including street and city if local pharmacy) is medication to be sent to? [facility] - [place], [place] - [address] [facility] AT Portal to Registered [facility] Sites    5. Do they need a 30 day or 90 day supply? 90 day Pt has appt on 10/22/23

## 2023-10-19 NOTE — Progress Notes (Unsigned)
 Cardiology Office Note    Date:  10/22/2023   ID:  Brittany Sawyer, DOB 08-15-1940, MRN [id number]  PCP:  Claud Crumb, MD  Cardiologist:  Dr. Swaziland  No chief complaint on file.   History of Present Illness:  Brittany Sawyer is a 83 y.o. female with PMH of hypothyroidism s/p radioactive iodine thyroid  ablation, GERD and hyperlipidemia. She does have family history of CAD. She has been intolerant to Lipitor due to joint aches. Given the exertional nature of her chest discomfort, a stress Myoview was obtained on 03/03/2015 was considered to be low risk. She is on low dose Crestor  and tolerates this OK.    Echocardiogram obtained on 04/09/2016 showed EF 65-70%, grade 1 diastolic dysfunction, mild TR. She was later started on losartan  25 mg daily for HTN.  On follow up today she is doing very well. BP has been consistently in the 140s systolic. Pulse around 55.  She denies any chest pain or dyspnea.    Past Medical History:  Diagnosis Date   GERD (gastroesophageal reflux disease)    Hyperlipidemia    Hypothyroid    S/P radioactive iodine thyroid  ablation     Past Surgical History:  Procedure Laterality Date   right ankle fracture surgery      Current Medications: Outpatient Medications Prior to Visit  Medication Sig Dispense Refill   ALPHAGAN P 0.1 % SOLN Place 1 drop into both eyes 3 (three) times daily.     carboxymethylcellul-glycerin (OPTIVE) 0.5-0.9 % ophthalmic solution Place 2 drops into both eyes as needed for dry eyes.     esomeprazole (NEXIUM) 20 MG capsule Take by mouth.     fluticasone (FLONASE) 50 MCG/ACT nasal spray Place 1 spray into both nostrils as needed for rhinitis or allergies.     hydrocortisone (ANUSOL-HC) 25 MG suppository Place 25 mg rectally 2 (two) times daily as needed for hemorrhoids.     levothyroxine (SYNTHROID, LEVOTHROID) 125 MCG tablet Take 125 mcg by mouth daily before breakfast.     loratadine (CLARITIN) 10 MG tablet Take 10 mg by mouth  daily.     Magnesium 100 MG CAPS Take 1 capsule by mouth daily.     metroNIDAZOLE  (FLAGYL ) 500 MG tablet Take 1 tablet (500 mg total) by mouth 2 (two) times daily. One po bid x 7 days 14 tablet 0   Multiple Vitamins-Minerals (MULTIVITAMIN ADULTS 50+) TABS daily.     Omega-3 Fatty Acids (FISH OIL) 300 MG CAPS daily.     rosuvastatin  (CRESTOR ) 5 MG tablet Take 1 tablet (5 mg total) by mouth every other day. 90 tablet 3   SYNTHROID 100 MCG tablet Take 100 mcg by mouth daily.     timolol (TIMOPTIC) 0.25 % ophthalmic solution 2 drops 2 (two) times daily.     ZIOPTAN 0.0015 % SOLN One drop each eye at bed time     losartan  (COZAAR ) 25 MG tablet Take 1 tablet (25 mg total) by mouth daily. 90 tablet 0   ciprofloxacin  (CIPRO ) 500 MG tablet Take 1 tablet (500 mg total) by mouth 2 (two) times daily. (Patient not taking: Reported on 10/22/2023) 14 tablet 0   hydrOXYzine  (ATARAX ) 25 MG tablet Take 1 tablet (25 mg total) by mouth every 8 (eight) hours as needed for up to 30 doses. (Patient not taking: Reported on 10/22/2023) 30 tablet 0   No facility-administered medications prior to visit.     Allergies:   Diphenhydramine ; 2,4-d dimethylamine; Aspirin; Metoprolol ; Rosuvastatin ; Amoxicillin;  Cefdinir; Nitrofurantoin; and Sulfa antibiotics   Social History   Socioeconomic History   Marital status: Married    Spouse name: Not on file   Number of children: 2   Years of education: Not on file   Highest education level: Not on file  Occupational History   Not on file  Tobacco Use   Smoking status: Former    Current packs/day: 0.50    Average packs/day: 0.5 packs/day for 30.0 years (15.0 ttl pk-yrs)    Types: Cigarettes   Smokeless tobacco: Never  Substance and Sexual Activity   Alcohol use: No    Alcohol/week: 0.0 standard drinks of alcohol   Drug use: No   Sexual activity: Not on file  Other Topics Concern   Not on file  Social History Narrative   Not on file   Social Drivers of Health    Financial Resource Strain: Low Risk  (08/20/2023)   Received [facility]   Overall Financial Resource Strain (CARDIA)    Difficulty of Paying Living Expenses: Not very hard  Food Insecurity: No Food Insecurity (08/20/2023)   Received from [facility]   Hunger Vital Sign    Worried About Running Out of Food in the Last Year: Never true    Ran Out of Food in the Last Year: Never true  Transportation Needs: No Transportation Needs (08/20/2023)   Received from [facility] - Transportation    Lack of Transportation (Medical): No    Lack of Transportation (Non-Medical): No  Physical Activity: Insufficiently Active (08/20/2023)   Received from [facility]   Exercise Vital Sign    Days of Exercise per Week: 5 days    Minutes of Exercise per Session: 20 min  Stress: No Stress Concern Present (08/20/2023)   Received from [facility] - Occupational Stress Questionnaire    Feeling of Stress : Only a little  Social Connections: Moderately Integrated (08/20/2023)   Received from [facility]   Social Network    How would you rate your social network (family, work, friends)?: Adequate participation with social networks     Family History:  The patient's family history includes Cancer in her mother; Heart attack in her father; Heart disease in her brother and sister; Other in her daughter, sister, and son.   ROS:   Please see the history of present illness.    ROS All other systems reviewed and are negative.   PHYSICAL EXAM:   VS:  BP (!) 156/64 (BP Location: Left Arm, Patient Position: Sitting, Cuff Size: Normal)   Pulse (!) 55   Ht 5' (1.524 m)   Wt 132 lb 9.6 oz (60.1 kg)   SpO2 97%   BMI 25.90 kg/m    GENERAL:  Well appearing WF in NAD HEENT:  PERRL, EOMI, sclera are clear. Oropharynx is clear. NECK:  No jugular venous distention, carotid upstroke brisk and symmetric, no bruits, no thyromegaly or adenopathy LUNGS:   Clear to auscultation bilaterally CHEST:  Unremarkable HEART:  RRR,  PMI not displaced or sustained,S1 and S2 within normal limits, no S3, no S4: no clicks, no rubs, no murmurs ABD:  Soft, nontender. BS +, no masses or bruits. No hepatomegaly, no splenomegaly EXT:  2 + pulses throughout, no edema, no cyanosis no clubbing SKIN:  Warm and dry.  No rashes NEURO:  Alert and oriented x 3. Cranial nerves II through XII intact. PSYCH:  Cognitively intact  Wt Readings from Last 3 Encounters:  10/22/23 132 lb 9.6 oz (60.1 kg)  10/03/22 136 lb 3.2 oz (61.8 kg)  09/29/22 134 lb (60.8 kg)      Studies/Labs Reviewed:   EKG Interpretation Date/Time:  Tuesday Oct 22 2023 16:40:11 EDT Ventricular Rate:  55 PR Interval:  204 QRS Duration:  122 QT Interval:  450 QTC Calculation: 430 R Axis:   73  Text Interpretation: Sinus bradycardia Right bundle branch block When compared with ECG of March 28,2023  No significant change was found  Confirmed by [place], Niccole Witthuhn [phone]) on 10/22/2023 4:43:50 PM    Recent Labs: No results found for requested labs within last 365 days.   Lipid Panel No results found for: "CHOL", "TRIG", "HDL", "CHOLHDL", "VLDL", "LDLCALC", "LDLDIRECT"  Additional studies/ records that were reviewed today include:   Labs dated 02/16/16: Cholesterol 202, triglycerides 109, HDL 55, LDL 125, glucose 128. Other chemistries and CBC normal. Last TSH normal. Dated 08/26/17: CMET, CBC normal. A1c 6.2%. cholesterol 124, triglycerides 87, HDL 50, LDL 57.  Dated 01/05/19: Normal CBC and TSH.  Dated 02/24/19: Normal CMET except glucose 128.  Dated 12/24/19: CMET, CBC, TSH normal. Cholesterol 145, triglycerides 101, HDL 44, LDL 82.  Dated 04/07/21: cholesterol 129, triglycerides 87, HDL 48, LDL 64. Glucose 123, A1c 6.6%. CMET, TSH, CBC normal.   Myoview 03/03/2015 Study Highlights    The left ventricular ejection fraction is normal (55-65%). Nuclear stress EF: 66%. ST segment depression  of 2 mm was noted during stress in the II, aVF, III, V5 and V6 leads, beginning at 4 minutes of stress. T wave inversion was noted during stress. The study is normal. This is a low risk study.   1. Nl Perfusion and EF 2.Positive GXT 3. Low risk study. Can not R/O balanced ischemia       Echo 04/09/2016 LV EF: 65% -   70%   - Left ventricle: The cavity size was normal. Systolic function was   vigorous. The estimated ejection fraction was in the range of 65%   to 70%. Wall motion was normal; there were no regional wall   motion abnormalities. Doppler parameters are consistent with   abnormal left ventricular relaxation (grade 1 diastolic   dysfunction). Doppler parameters are consistent with elevated   ventricular end-diastolic filling pressure. - Aortic valve: Trileaflet; normal thickness leaflets. There was no   regurgitation. - Aortic root: The aortic root was normal in size. - Ascending aorta: The ascending aorta was normal in size. - Mitral valve: Calcified annulus. Mildly thickened leaflets .   There was trivial regurgitation. - Left atrium: The atrium was normal in size. - Right ventricle: Systolic function was normal. - Tricuspid valve: There was mild regurgitation. - Pulmonic valve: There was no regurgitation. - Pulmonary arteries: Systolic pressure was within the normal   range. - Inferior vena cava: The vessel was normal in size. - Pericardium, extracardiac: There was no pericardial effusion.   ASSESSMENT:    1. Primary hypertension   2. Mixed hyperlipidemia       PLAN:  In order of problems listed above:  HTN elevated. Recommend we increase losartan  to 50 mg daily  HLD: on low dose Crestor . Controlled.   Hypothyroidism: on Synthroid. TSH normal  4.   Prediabetes. Most recent A1c up to 6.8%. encouraged low carb diet. Follow up with PCP  Follow up in one year  Medication Adjustments/Labs and Tests Ordered: Current medicines are reviewed at length  with the patient  today.  Concerns regarding medicines are outlined above.  Medication changes, Labs and Tests ordered today are listed in the Patient Instructions below. Patient Instructions  Medication Instructions:  Increase Losartan  to 50 mg daily Continue all other medications *If you need a refill on your cardiac medications before your next appointment, please call your pharmacy*  Lab Work: None ordered  Testing/Procedures: None ordered  Follow-Up: At [facility], you and your health needs are our priority.  As part of our continuing mission to provide you with exceptional heart care, our providers are all part of one team.  This team includes your primary Cardiologist (physician) and Advanced Practice Providers or APPs (Physician Assistants and Nurse Practitioners) who all work together to provide you with the care you need, when you need it.  Your next appointment:  1 year   Call in Feb to schedule May appointment     Provider:  Dr.Asucena Galer   We recommend signing up for the patient portal called "MyChart".  Sign up information is provided on this After Visit Summary.  MyChart is used to connect with patients for Virtual Visits (Telemedicine).  Patients are able to view lab/test results, encounter notes, upcoming appointments, etc.  Non-urgent messages can be sent to your provider as well.   To learn more about what you can do with MyChart, go to [email or web address].       Signed, Nabilah Davoli Sw[place], MD  10/22/2023 5:21 PM    [facility] [address] [place], [place], [place]  [address] Phone: [phone]; Fax: [phone]

## 2023-10-22 ENCOUNTER — Encounter: Payer: Self-pay | Admitting: Cardiology

## 2023-10-22 ENCOUNTER — Ambulatory Visit: Payer: Federal, State, Local not specified - PPO | Attending: Cardiology | Admitting: Cardiology

## 2023-10-22 VITALS — BP 156/64 | HR 55 | Ht 60.0 in | Wt 132.6 lb

## 2023-10-22 DIAGNOSIS — E782 Mixed hyperlipidemia: Secondary | ICD-10-CM | POA: Diagnosis not present

## 2023-10-22 DIAGNOSIS — I10 Essential (primary) hypertension: Secondary | ICD-10-CM

## 2023-10-22 MED ORDER — LOSARTAN POTASSIUM 50 MG PO TABS: 50.0000 mg | ORAL_TABLET | Freq: Every day | ORAL | 3 refills | Status: DC

## 2023-10-22 NOTE — Patient Instructions (Signed)
 Medication Instructions:  Increase Losartan  to 50 mg daily Continue all other medications *If you need a refill on your cardiac medications before your next appointment, please call your pharmacy*  Lab Work: None ordered  Testing/Procedures: None ordered  Follow-Up: At [facility], you and your health needs are our priority.  As part of our continuing mission to provide you with exceptional heart care, our providers are all part of one team.  This team includes your primary Cardiologist (physician) and Advanced Practice Providers or APPs (Physician Assistants and Nurse Practitioners) who all work together to provide you with the care you need, when you need it.  Your next appointment:  1 year   Call in Feb to schedule May appointment     Provider:  Dr.Jordan   We recommend signing up for the patient portal called "MyChart".  Sign up information is provided on this After Visit Summary.  MyChart is used to connect with patients for Virtual Visits (Telemedicine).  Patients are able to view lab/test results, encounter notes, upcoming appointments, etc.  Non-urgent messages can be sent to your provider as well.   To learn more about what you can do with MyChart, go to [email or web address].

## 2023-11-11 ENCOUNTER — Encounter: Payer: Self-pay | Admitting: Cardiology

## 2024-01-02 ENCOUNTER — Other Ambulatory Visit: Payer: Self-pay

## 2024-01-02 ENCOUNTER — Encounter: Payer: Self-pay | Admitting: Cardiology

## 2024-01-02 MED ORDER — LOSARTAN POTASSIUM 50 MG PO TABS: 50.0000 mg | ORAL_TABLET | Freq: Every day | ORAL | 3 refills | Status: DC

## 2024-01-02 NOTE — Telephone Encounter (Signed)
 Spoke to patient Dr.Jordan advised to increase Losartan  to 50 mg daily.

## 2024-01-07 ENCOUNTER — Other Ambulatory Visit: Payer: Self-pay | Admitting: Obstetrics and Gynecology

## 2024-01-07 DIAGNOSIS — Z1231 Encounter for screening mammogram for malignant neoplasm of breast: Secondary | ICD-10-CM

## 2024-01-29 ENCOUNTER — Encounter: Payer: Self-pay | Admitting: Cardiology

## 2024-01-30 ENCOUNTER — Other Ambulatory Visit: Payer: Self-pay

## 2024-01-30 MED ORDER — LOSARTAN POTASSIUM 100 MG PO TABS
100.0000 mg | ORAL_TABLET | Freq: Every day | ORAL | Status: DC
Start: 2024-01-30 — End: 2024-03-06

## 2024-01-30 NOTE — Progress Notes (Signed)
 Medication list updated.

## 2024-03-04 NOTE — Telephone Encounter (Signed)
 Spoke to patient he stated he would like to speak to [employer].Advised I will sent message to him.

## 2024-03-06 ENCOUNTER — Other Ambulatory Visit: Payer: Self-pay

## 2024-03-06 ENCOUNTER — Encounter: Payer: Self-pay | Admitting: Cardiology

## 2024-03-06 NOTE — Telephone Encounter (Addendum)
 Spoke to patient she stated she has had a upper respiratory infection for the past 10 days.Frequent productive cough.She was prescribed prednisone ,antibiotic and a cough syrup.Stated she finished taking prednisone  and antibiotic this past Monday.Stated she continues to have a frequent productive cough.Advised she needs to call PCP.  Stated she is concerned about elevated B/P ranging 160/67,159/66,147/67,161/67.Pulse 50's.She has been taking Losartan  50 mg daily not 100 mg as list on med list.Advised I will send message to Dr.Jordan for advice.

## 2024-03-06 NOTE — Telephone Encounter (Signed)
 Spoke to patient Dr.Jordan advised to increase Losartan  to 100 mg daily until B/P assessed by PCP.

## 2024-03-26 ENCOUNTER — Encounter: Payer: Self-pay | Admitting: Cardiology

## 2024-04-21 ENCOUNTER — Ambulatory Visit
